# Patient Record
Sex: Female | Born: 1965
Health system: Southern US, Community
[De-identification: ages and names within clinical notes are randomized; demographics above are authoritative.]

---

## 2001-08-02 ENCOUNTER — Other Ambulatory Visit: Admission: RE | Admit: 2001-08-02 | Discharge: 2001-08-02 | Payer: Self-pay | Admitting: Obstetrics and Gynecology

## 2002-08-04 ENCOUNTER — Other Ambulatory Visit: Admission: RE | Admit: 2002-08-04 | Discharge: 2002-08-04 | Payer: Self-pay | Admitting: Obstetrics and Gynecology

## 2004-06-29 ENCOUNTER — Other Ambulatory Visit: Admission: RE | Admit: 2004-06-29 | Discharge: 2004-06-29 | Payer: Self-pay | Admitting: Obstetrics and Gynecology

## 2004-11-18 ENCOUNTER — Ambulatory Visit: Payer: Self-pay | Admitting: Family Medicine

## 2006-02-05 ENCOUNTER — Other Ambulatory Visit: Admission: RE | Admit: 2006-02-05 | Discharge: 2006-02-05 | Payer: Self-pay | Admitting: Obstetrics and Gynecology

## 2007-03-04 ENCOUNTER — Other Ambulatory Visit: Admission: RE | Admit: 2007-03-04 | Discharge: 2007-03-04 | Payer: Self-pay | Admitting: Obstetrics and Gynecology

## 2008-04-28 ENCOUNTER — Ambulatory Visit (HOSPITAL_COMMUNITY): Admission: RE | Admit: 2008-04-28 | Discharge: 2008-04-28 | Payer: Self-pay | Admitting: Obstetrics and Gynecology

## 2008-06-18 ENCOUNTER — Encounter: Payer: Self-pay | Admitting: Obstetrics and Gynecology

## 2008-06-18 ENCOUNTER — Other Ambulatory Visit: Admission: RE | Admit: 2008-06-18 | Discharge: 2008-06-18 | Payer: Self-pay | Admitting: Obstetrics and Gynecology

## 2008-06-18 ENCOUNTER — Ambulatory Visit: Payer: Self-pay | Admitting: Obstetrics and Gynecology

## 2008-09-16 ENCOUNTER — Emergency Department (HOSPITAL_COMMUNITY): Admission: EM | Admit: 2008-09-16 | Discharge: 2008-09-16 | Payer: Self-pay | Admitting: Emergency Medicine

## 2009-05-10 ENCOUNTER — Ambulatory Visit (HOSPITAL_COMMUNITY): Admission: RE | Admit: 2009-05-10 | Discharge: 2009-05-10 | Payer: Self-pay | Admitting: Obstetrics and Gynecology

## 2010-02-09 ENCOUNTER — Other Ambulatory Visit: Admission: RE | Admit: 2010-02-09 | Discharge: 2010-02-09 | Payer: Self-pay | Admitting: Obstetrics and Gynecology

## 2010-02-09 ENCOUNTER — Ambulatory Visit: Payer: Self-pay | Admitting: Obstetrics and Gynecology

## 2010-06-16 ENCOUNTER — Ambulatory Visit (HOSPITAL_COMMUNITY): Admission: RE | Admit: 2010-06-16 | Discharge: 2010-06-16 | Payer: Self-pay | Admitting: Obstetrics and Gynecology

## 2010-06-29 ENCOUNTER — Encounter: Admission: RE | Admit: 2010-06-29 | Discharge: 2010-06-29 | Payer: Self-pay | Admitting: Obstetrics and Gynecology

## 2010-10-08 ENCOUNTER — Encounter: Payer: Self-pay | Admitting: Obstetrics and Gynecology

## 2011-01-31 NOTE — Op Note (Signed)
NAME:  Carolyn Woodward, Carolyn Woodward NO.:  [id number]   MEDICAL RECORD NO.:  [id number]          PATIENT TYPE:  EMS   LOCATION:  [facility]                         FACILITY:  [facility]   PHYSICIAN:  Vanita Panda. Magnus Ivan, M.D.DATE OF BIRTH:  1966/01/10   DATE OF PROCEDURE:  DATE OF DISCHARGE:                               OPERATIVE REPORT   PREPROCEDURE DIAGNOSIS:  Right index finger tip crush injury with tuft  fracture and nail bed injury.   POSTPROCEDURE DIAGNOSIS:  Right index finger tip crush injury with tuft  fracture and nail bed injury.   PROCEDURE:  Right index finger nail bed repair.   SURGEON:  Vanita Panda. Magnus Ivan, MD   ANESTHESIA:  Local 1% plain lidocaine digital block, right index finger.   COMPLICATIONS:  None.   INDICATIONS:  Briefly, Ms. 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  She sustained a crushing  injury to the tip, and she came to the ER with the nail that had been  pulled out as well as the crush injury to her fingertip.  X-rays were  obtained and did show a tuft fracture of the distal phalanx.  The joints  were intact.  She had a normal exam as far as sensation of the tip per  the ER physicians' report before we blocked her with a lidocaine block  due to significant pain.  She had full range of motion of her MCP, PIP,  and DIP joints.   PROCEDURE DESCRIPTION:  After prepping the finger with Betadine, I  assessed the nail bed and found it to be significantly disrupted.  A  piece of bone from the very tip of the finger was removed easily.  I  then thoroughly irrigated the tissue around this normal saline solution  and repaired the nail bed with interrupted 5-0 chromic suture.  I had to  repair a laceration at the tip of the finger as well with interrupted 5-  0 Prolene suture.  The nail matrix itself seemed to be open, so I did  take  her nail that came with her, cleaned it with Betadine, then alcohol  and saline.  I placed several 5-0 Prolene sutures through the nail and  secured it up under the nail fold to serve as a spacer to allow a new  nail to hopefully grow back.  I then placed Xeroform around her finger,  followed by a well-padded sterile dressing.  The patient tolerated the  procedure well without complications.  Followup through the office in 4  days with dressing changes daily.  I will keep her on sedentary work  only and will address further injuries as needed.  She was discharged on  Hydrocodone for pain as well as doxycycline as antibiotic.      Vanita Panda. Magnus Ivan, M.D.  Electronically Signed     CYB/MEDQ  D:  09/16/2008  T:  09/17/2008  Job:  [id number]

## 2011-05-11 ENCOUNTER — Other Ambulatory Visit (HOSPITAL_COMMUNITY)
Admission: RE | Admit: 2011-05-11 | Discharge: 2011-05-11 | Disposition: A | Discharge status: Home / Self Care | Payer: BC Managed Care – PPO | Source: Ambulatory Visit | Attending: Obstetrics and Gynecology | Admitting: Obstetrics and Gynecology

## 2011-05-11 ENCOUNTER — Ambulatory Visit (INDEPENDENT_AMBULATORY_CARE_PROVIDER_SITE_OTHER): Payer: BC Managed Care – PPO | Admitting: Obstetrics and Gynecology

## 2011-05-11 ENCOUNTER — Encounter: Payer: Self-pay | Admitting: Obstetrics and Gynecology

## 2011-05-11 VITALS — BP 114/70 | Ht 65.0 in | Wt 122.0 lb

## 2011-05-11 DIAGNOSIS — Z01419 Encounter for gynecological examination (general) (routine) without abnormal findings: Secondary | ICD-10-CM | POA: Insufficient documentation

## 2011-05-11 NOTE — Progress Notes (Signed)
Patient came to see me today for an annual GYN exam. She is doing well with normal periods. She contracepts with condoms and is fine doing so.she is up-to-date on her mammograms.  Physical examination: HEENT within normal limits. Neck: Thyroid not large. No masses. Supraclavicular nodes: not enlarged. Breasts: Examined in both sitting midline position. No skin changes and no masses. Abdomen: Soft no guarding rebound or masses or hernia. Pelvic: External: Within normal limits. BUS: Within normal limits. Vaginal:within normal limits. Good estrogen effect. No evidence of cystocele rectocele or enterocele. Cervix: clean. Uterus: Normal size and shape. Adnexa: No masses. Rectovaginal exam: Confirmatory and negative. Extremities: Within normal limits.  Assessment: Normal GYN exam  Plan: Continue yearly mammograms. Discussed Mirena IUD, she will inform me when necessary.

## 2012-02-14 ENCOUNTER — Other Ambulatory Visit: Payer: Self-pay | Admitting: Obstetrics and Gynecology

## 2012-02-14 DIAGNOSIS — Z1231 Encounter for screening mammogram for malignant neoplasm of breast: Secondary | ICD-10-CM

## 2012-02-29 ENCOUNTER — Ambulatory Visit
Admission: RE | Admit: 2012-02-29 | Discharge: 2012-02-29 | Disposition: A | Discharge status: Home / Self Care | Payer: BC Managed Care – PPO | Source: Ambulatory Visit | Attending: Obstetrics and Gynecology | Admitting: Obstetrics and Gynecology

## 2012-02-29 DIAGNOSIS — Z1231 Encounter for screening mammogram for malignant neoplasm of breast: Secondary | ICD-10-CM

## 2012-08-28 ENCOUNTER — Ambulatory Visit (INDEPENDENT_AMBULATORY_CARE_PROVIDER_SITE_OTHER): Payer: BC Managed Care – PPO | Admitting: Obstetrics and Gynecology

## 2012-08-28 ENCOUNTER — Other Ambulatory Visit (HOSPITAL_COMMUNITY)
Admission: RE | Admit: 2012-08-28 | Discharge: 2012-08-28 | Disposition: A | Discharge status: Home / Self Care | Payer: BC Managed Care – PPO | Source: Ambulatory Visit | Attending: Obstetrics and Gynecology | Admitting: Obstetrics and Gynecology

## 2012-08-28 ENCOUNTER — Encounter: Payer: Self-pay | Admitting: Obstetrics and Gynecology

## 2012-08-28 VITALS — BP 114/66 | Ht 65.0 in | Wt 125.0 lb

## 2012-08-28 DIAGNOSIS — Z01419 Encounter for gynecological examination (general) (routine) without abnormal findings: Secondary | ICD-10-CM | POA: Insufficient documentation

## 2012-08-28 DIAGNOSIS — N841 Polyp of cervix uteri: Secondary | ICD-10-CM

## 2012-08-28 NOTE — Patient Instructions (Signed)
Continue yearly mammograms. We will call you with polyp results.

## 2012-08-28 NOTE — Addendum Note (Signed)
Addended by: Dayna Barker on: 08/28/2012 04:33 PM   Modules accepted: Orders

## 2012-08-28 NOTE — Progress Notes (Signed)
Patient came to see me today for her annual GYN exam. She is having regular cycles. She uses condoms for birth control. She is having no unusual pain or bleeding. She has always had an extremely prominent right rib above her breast which we feel on exam. It has been there her whole life. When she's had mammograms we have imaged  that area specifically with no abnormal findings. She has always had normal Pap smears. Her last Pap smear was 2012. She just had her yearly mammogram. She brought her lab work from work.  Physical examination:Carolyn Woodward. HEENT within normal limits. Neck: Thyroid not large. No masses. Supraclavicular nodes: not enlarged. Breasts: Examined in both sitting and lying  position. No skin changes and no masses.In the area of the right breast above the nipple there is a firm area which corresponds with the rib and is unchanged from all her previous exams here. It is consistent with her skeleton and not a breast mass and once again no breast mass was found on mammograms. Abdomen: Soft no guarding rebound or masses or hernia. Pelvic: External: Within normal limits. BUS: Within normal limits. Vaginal:within normal limits. Good estrogen effect. No evidence of cystocele rectocele or enterocele. Cervix: Polyp coming out of external os.  Uterus: Normal size and shape. Adnexa: No masses. Rectovaginal exam: Confirmatory and negative. Extremities: Within normal limits.  Assessment: Endocervical polyp  Plan:The new Pap smear guidelines were discussed with the patient. Pap done at patient's request. Cervical polypectomy done with ease. We will call patient with results. Continue yearly  mammograms

## 2012-08-29 LAB — URINALYSIS W MICROSCOPIC + REFLEX CULTURE
Bacteria, UA: NONE SEEN
Casts: NONE SEEN
Hgb urine dipstick: NEGATIVE
Ketones, ur: NEGATIVE mg/dL
Leukocytes, UA: NEGATIVE
Nitrite: NEGATIVE
Specific Gravity, Urine: 1.01 (ref 1.005–1.030)
Urobilinogen, UA: 0.2 mg/dL (ref 0.0–1.0)
pH: 7 (ref 5.0–8.0)

## 2012-08-29 LAB — CBC WITH DIFFERENTIAL/PLATELET
Basophils Absolute: 0 10*3/uL (ref 0.0–0.1)
Eosinophils Relative: 1 % (ref 0–5)
HCT: 36.9 % (ref 36.0–46.0)
Hemoglobin: 12.3 g/dL (ref 12.0–15.0)
Lymphocytes Relative: 33 % (ref 12–46)
Lymphs Abs: 1.7 10*3/uL (ref 0.7–4.0)
MCV: 87.2 fL (ref 78.0–100.0)
Monocytes Absolute: 0.5 10*3/uL (ref 0.1–1.0)
Monocytes Relative: 10 % (ref 3–12)
Neutro Abs: 3 10*3/uL (ref 1.7–7.7)
RDW: 13.8 % (ref 11.5–15.5)
WBC: 5.4 10*3/uL (ref 4.0–10.5)

## 2013-07-07 ENCOUNTER — Other Ambulatory Visit: Payer: Self-pay

## 2013-07-07 DIAGNOSIS — Z1231 Encounter for screening mammogram for malignant neoplasm of breast: Secondary | ICD-10-CM

## 2013-07-25 ENCOUNTER — Ambulatory Visit
Admission: RE | Admit: 2013-07-25 | Discharge: 2013-07-25 | Disposition: A | Discharge status: Home / Self Care | Payer: BC Managed Care – PPO | Source: Ambulatory Visit

## 2013-07-25 DIAGNOSIS — Z1231 Encounter for screening mammogram for malignant neoplasm of breast: Secondary | ICD-10-CM

## 2014-04-10 ENCOUNTER — Ambulatory Visit (INDEPENDENT_AMBULATORY_CARE_PROVIDER_SITE_OTHER): Payer: BC Managed Care – PPO | Admitting: Gynecology

## 2014-04-10 ENCOUNTER — Other Ambulatory Visit: Payer: Self-pay | Admitting: Gynecology

## 2014-04-10 ENCOUNTER — Encounter: Payer: Self-pay | Admitting: Gynecology

## 2014-04-10 ENCOUNTER — Other Ambulatory Visit (HOSPITAL_COMMUNITY)
Admission: RE | Admit: 2014-04-10 | Discharge: 2014-04-10 | Disposition: A | Discharge status: Home / Self Care | Payer: BC Managed Care – PPO | Source: Ambulatory Visit | Attending: Gynecology | Admitting: Gynecology

## 2014-04-10 VITALS — BP 110/68 | Ht 65.0 in | Wt 119.0 lb

## 2014-04-10 DIAGNOSIS — Z309 Encounter for contraceptive management, unspecified: Secondary | ICD-10-CM

## 2014-04-10 DIAGNOSIS — Z1151 Encounter for screening for human papillomavirus (HPV): Secondary | ICD-10-CM | POA: Insufficient documentation

## 2014-04-10 DIAGNOSIS — Z01419 Encounter for gynecological examination (general) (routine) without abnormal findings: Secondary | ICD-10-CM | POA: Insufficient documentation

## 2014-04-10 DIAGNOSIS — N841 Polyp of cervix uteri: Secondary | ICD-10-CM

## 2014-04-10 LAB — COMPREHENSIVE METABOLIC PANEL
ALK PHOS: 81 U/L (ref 39–117)
ALT: 27 U/L (ref 0–35)
AST: 28 U/L (ref 0–37)
Albumin: 3.7 g/dL (ref 3.5–5.2)
BUN: 12 mg/dL (ref 6–23)
CALCIUM: 8.4 mg/dL (ref 8.4–10.5)
CHLORIDE: 102 meq/L (ref 96–112)
CO2: 24 mEq/L (ref 19–32)
CREATININE: 0.7 mg/dL (ref 0.50–1.10)
Glucose, Bld: 79 mg/dL (ref 70–99)
POTASSIUM: 3.6 meq/L (ref 3.5–5.3)
Sodium: 135 mEq/L (ref 135–145)
Total Bilirubin: 1.5 mg/dL — ABNORMAL HIGH (ref 0.2–1.2)
Total Protein: 6.2 g/dL (ref 6.0–8.3)

## 2014-04-10 LAB — LIPID PANEL
CHOL/HDL RATIO: 1.8 ratio
Cholesterol: 134 mg/dL (ref 0–200)
HDL: 76 mg/dL (ref >39)
LDL CALC: 47 mg/dL (ref 0–99)
Triglycerides: 57 mg/dL (ref <150)
VLDL: 11 mg/dL (ref 0–40)

## 2014-04-10 LAB — CBC WITH DIFFERENTIAL/PLATELET
BASOS ABS: 0 10*3/uL (ref 0.0–0.1)
Basophils Relative: 0 % (ref 0–1)
EOS PCT: 0 % (ref 0–5)
Eosinophils Absolute: 0 10*3/uL (ref 0.0–0.7)
HCT: 31.8 % — ABNORMAL LOW (ref 36.0–46.0)
Hemoglobin: 10.9 g/dL — ABNORMAL LOW (ref 12.0–15.0)
LYMPHS PCT: 28 % (ref 12–46)
Lymphs Abs: 1.3 10*3/uL (ref 0.7–4.0)
MCH: 29.3 pg (ref 26.0–34.0)
MCHC: 34.3 g/dL (ref 30.0–36.0)
MCV: 85.5 fL (ref 78.0–100.0)
Monocytes Absolute: 0.5 10*3/uL (ref 0.1–1.0)
Monocytes Relative: 10 % (ref 3–12)
NEUTROS ABS: 2.9 10*3/uL (ref 1.7–7.7)
Neutrophils Relative %: 62 % (ref 43–77)
PLATELETS: 161 10*3/uL (ref 150–400)
RBC: 3.72 MIL/uL — ABNORMAL LOW (ref 3.87–5.11)
RDW: 13.7 % (ref 11.5–15.5)
WBC: 4.7 10*3/uL (ref 4.0–10.5)

## 2014-04-10 NOTE — Addendum Note (Signed)
Addended by: Richardson ChiquitoWILKINSON, Minda Faas S on: 04/10/2014 12:42 PM   Modules accepted: Orders

## 2014-04-10 NOTE — Progress Notes (Signed)
Carolyn Woodward Aug 25, 1966 [id number]        48 y.o.  G2P1011 for annual exam.  Doing well. Former patient Carolyn Woodward.  Past medical history,surgical history, problem list, medications, allergies, family history and social history were all reviewed and documented as reviewed in the EPIC chart.  ROS:  12 system ROS performed with pertinent positives and negatives included in the history, assessment and plan.   Additional significant findings :  None   Exam: Carolyn RistKari Woodward Filed Vitals:   04/10/14 1206  BP: 110/68  Height: 5\' 5"  (1.651 m)  Weight: 119 lb (53.978 kg)   General appearance:  Normal affect, orientation and appearance. Skin: Grossly normal HEENT: Without gross lesions.  No cervical or supraclavicular adenopathy. Thyroid normal.  Lungs:  Clear without wheezing, rales or rhonchi Cardiac: RR, without RMG Abdominal:  Soft, nontender, without masses, guarding, rebound, organomegaly or hernia Breasts:  Examined lying and sitting without masses, retractions, discharge or axillary adenopathy. Pelvic:  Ext/BUS/vagina normal  Cervix with endocervical polyp extruding from the external os. Biopsied off and sent to pathology. Pap ECC done before biopsy.  Uterus anteverted, normal size, shape and contour, midline and mobile nontender   Adnexa  Without masses or tenderness    Anus and perineum  Normal   Rectovaginal  Normal sphincter tone without palpated masses or tenderness.    Assessment/Plan:  48 y.o. 522P1011 female for annual exam with regular menses, condom contraception.   1. Contraceptive management. Patient using condoms as she has for years and is comfortable with this. Reviewed options and she declines alternatives. I reviewed failure risks and availability of plan B. 2. Endocervical polyp. Had one 2 years ago when she saw Carolyn Woodward which was biopsied off and benign. I biopsied this one off and sent to pathology. Patient will followup results. 3. Pap smear 2013. HPV  done before the biopsy. No history of significant abnormal Pap smears. 4. Mammography 07/2013. Continue with annual mammography this fall. SBE monthly reviewed. 5. Health maintenance. Sign CBC comprehensive metabolic panel lipid profile urinalysis ordered. Followup one year, sooner as needed.   Note: This document was prepared with digital dictation and possible smart phrase technology. Any transcriptional errors that result from this process are unintentional.   Carolyn LordsFONTAINE,TIMOTHY P MD, 12:29 PM 04/10/2014

## 2014-04-10 NOTE — Addendum Note (Signed)
Addended by: Richardson ChiquitoWILKINSON, KARI S on: 04/10/2014 12:39 PM   Modules accepted: Orders

## 2014-04-10 NOTE — Patient Instructions (Signed)
Office will call you with biopsy results.  You may obtain a copy of any labs that were done today by logging onto MyChart as outlined in the instructions provided with your AVS (after visit summary). The office will not call with normal lab results but certainly if there are any significant abnormalities then we will contact you.   Health Maintenance, Female A healthy lifestyle and preventative care can promote health and wellness.  Maintain regular health, dental, and eye exams.  Eat a healthy diet. Foods like vegetables, fruits, whole grains, low-fat dairy products, and lean protein foods contain the nutrients you need without too many calories. Decrease your intake of foods high in solid fats, added sugars, and salt. Get information about a proper diet from your caregiver, if necessary.  Regular physical exercise is one of the most important things you can do for your health. Most adults should get at least 150 minutes of moderate-intensity exercise (any activity that increases your heart rate and causes you to sweat) each week. In addition, most adults need muscle-strengthening exercises on 2 or more days a week.   Maintain a healthy weight. The body mass index (BMI) is a screening tool to identify possible weight problems. It provides an estimate of body fat based on height and weight. Your caregiver can help determine your BMI, and can help you achieve or maintain a healthy weight. For adults 20 years and older:  A BMI below 18.5 is considered underweight.  A BMI of 18.5 to 24.9 is normal.  A BMI of 25 to 29.9 is considered overweight.  A BMI of 30 and above is considered obese.  Maintain normal blood lipids and cholesterol by exercising and minimizing your intake of saturated fat. Eat a balanced diet with plenty of fruits and vegetables. Blood tests for lipids and cholesterol should begin at age 20 and be repeated every 5 years. If your lipid or cholesterol levels are high, you are  over 50, or you are a high risk for heart disease, you may need your cholesterol levels checked more frequently.Ongoing high lipid and cholesterol levels should be treated with medicines if diet and exercise are not effective.  If you smoke, find out from your caregiver how to quit. If you do not use tobacco, do not start.  Lung cancer screening is recommended for adults aged 55 80 years who are at high risk for developing lung cancer because of a history of smoking. Yearly low-dose computed tomography (CT) is recommended for people who have at least a 30-pack-year history of smoking and are a current smoker or have quit within the past 15 years. A pack year of smoking is smoking an average of 1 pack of cigarettes a day for 1 year (for example: 1 pack a day for 30 years or 2 packs a day for 15 years). Yearly screening should continue until the smoker has stopped smoking for at least 15 years. Yearly screening should also be stopped for people who develop a health problem that would prevent them from having lung cancer treatment.  If you are pregnant, do not drink alcohol. If you are breastfeeding, be very cautious about drinking alcohol. If you are not pregnant and choose to drink alcohol, do not exceed 1 drink per day. One drink is considered to be 12 ounces (355 mL) of beer, 5 ounces (148 mL) of wine, or 1.5 ounces (44 mL) of liquor.  Avoid use of street drugs. Do not share needles with anyone. Ask for help   if you need support or instructions about stopping the use of drugs.  High blood pressure causes heart disease and increases the risk of stroke. Blood pressure should be checked at least every 1 to 2 years. Ongoing high blood pressure should be treated with medicines, if weight loss and exercise are not effective.  If you are 55 to 47 years old, ask your caregiver if you should take aspirin to prevent strokes.  Diabetes screening involves taking a blood sample to check your fasting blood sugar level.  This should be done once every 3 years, after age 45, if you are within normal weight and without risk factors for diabetes. Testing should be considered at a younger age or be carried out more frequently if you are overweight and have at least 1 risk factor for diabetes.  Breast cancer screening is essential preventative care for women. You should practice "breast self-awareness." This means understanding the normal appearance and feel of your breasts and may include breast self-examination. Any changes detected, no matter how small, should be reported to a caregiver. Women in their 20s and 30s should have a clinical breast exam (CBE) by a caregiver as part of a regular health exam every 1 to 3 years. After age 40, women should have a CBE every year. Starting at age 40, women should consider having a mammogram (breast X-ray) every year. Women who have a family history of breast cancer should talk to their caregiver about genetic screening. Women at a high risk of breast cancer should talk to their caregiver about having an MRI and a mammogram every year.  Breast cancer gene (BRCA)-related cancer risk assessment is recommended for women who have family members with BRCA-related cancers. BRCA-related cancers include breast, ovarian, tubal, and peritoneal cancers. Having family members with these cancers may be associated with an increased risk for harmful changes (mutations) in the breast cancer genes BRCA1 and BRCA2. Results of the assessment will determine the need for genetic counseling and BRCA1 and BRCA2 testing.  The Pap test is a screening test for cervical cancer. Women should have a Pap test starting at age 21. Between ages 21 and 29, Pap tests should be repeated every 2 years. Beginning at age 30, you should have a Pap test every 3 years as long as the past 3 Pap tests have been normal. If you had a hysterectomy for a problem that was not cancer or a condition that could lead to cancer, then you no  longer need Pap tests. If you are between ages 65 and 70, and you have had normal Pap tests going back 10 years, you no longer need Pap tests. If you have had past treatment for cervical cancer or a condition that could lead to cancer, you need Pap tests and screening for cancer for at least 20 years after your treatment. If Pap tests have been discontinued, risk factors (such as a new sexual partner) need to be reassessed to determine if screening should be resumed. Some women have medical problems that increase the chance of getting cervical cancer. In these cases, your caregiver may recommend more frequent screening and Pap tests.  The human papillomavirus (HPV) test is an additional test that may be used for cervical cancer screening. The HPV test looks for the virus that can cause the cell changes on the cervix. The cells collected during the Pap test can be tested for HPV. The HPV test could be used to screen women aged 30 years and older, and   should be used in women of any age who have unclear Pap test results. After the age of 7, women should have HPV testing at the same frequency as a Pap test.  Colorectal cancer can be detected and often prevented. Most routine colorectal cancer screening begins at the age of 20 and continues through age 36. However, your caregiver may recommend screening at an earlier age if you have risk factors for colon cancer. On a yearly basis, your caregiver may provide home test kits to check for hidden blood in the stool. Use of a small camera at the end of a tube, to directly examine the colon (sigmoidoscopy or colonoscopy), can detect the earliest forms of colorectal cancer. Talk to your caregiver about this at age 46, when routine screening begins. Direct examination of the colon should be repeated every 5 to 10 years through age 62, unless early forms of pre-cancerous polyps or small growths are found.  Hepatitis C blood testing is recommended for all people born  from [birth year] through 1965 and any individual with known risks for hepatitis C.  Practice safe sex. Use condoms and avoid high-risk sexual practices to reduce the spread of sexually transmitted infections (STIs). Sexually active women aged 55 and younger should be checked for Chlamydia, which is a common sexually transmitted infection. Older women with new or multiple partners should also be tested for Chlamydia. Testing for other STIs is recommended if you are sexually active and at increased risk.  Osteoporosis is a disease in which the bones lose minerals and strength with aging. This can result in serious bone fractures. The risk of osteoporosis can be identified using a bone density scan. Women ages 31 and over and women at risk for fractures or osteoporosis should discuss screening with their caregivers. Ask your caregiver whether you should be taking a calcium supplement or vitamin D to reduce the rate of osteoporosis.  Menopause can be associated with physical symptoms and risks. Hormone replacement therapy is available to decrease symptoms and risks. You should talk to your caregiver about whether hormone replacement therapy is right for you.  Use sunscreen. Apply sunscreen liberally and repeatedly throughout the day. You should seek shade when your shadow is shorter than you. Protect yourself by wearing long sleeves, pants, a wide-brimmed hat, and sunglasses year round, whenever you are outdoors.  Notify your caregiver of new moles or changes in moles, especially if there is a change in shape or color. Also notify your caregiver if a mole is larger than the size of a pencil eraser.  Stay current with your immunizations. Document Released: 03/20/2011 Document Revised: 12/30/2012 Document Reviewed: 03/20/2011 [facility] Patient Information 2014 [place].

## 2014-04-11 LAB — URINALYSIS W MICROSCOPIC + REFLEX CULTURE
Bilirubin Urine: NEGATIVE
Casts: NONE SEEN
Crystals: NONE SEEN
GLUCOSE, UA: NEGATIVE mg/dL
Hgb urine dipstick: NEGATIVE
Ketones, ur: 15 mg/dL — AB
LEUKOCYTES UA: NEGATIVE
NITRITE: NEGATIVE
PROTEIN: NEGATIVE mg/dL
SQUAMOUS EPITHELIAL / LPF: NONE SEEN
Specific Gravity, Urine: 1.007 (ref 1.005–1.030)
UROBILINOGEN UA: 0.2 mg/dL (ref 0.0–1.0)
pH: 5.5 (ref 5.0–8.0)

## 2014-04-14 LAB — CYTOLOGY - PAP

## 2014-07-20 ENCOUNTER — Encounter: Payer: Self-pay | Admitting: Gynecology

## 2015-03-30 ENCOUNTER — Other Ambulatory Visit: Payer: Self-pay

## 2015-03-30 DIAGNOSIS — Z1231 Encounter for screening mammogram for malignant neoplasm of breast: Secondary | ICD-10-CM

## 2015-04-07 ENCOUNTER — Ambulatory Visit
Admission: RE | Admit: 2015-04-07 | Discharge: 2015-04-07 | Disposition: A | Discharge status: Home / Self Care | Payer: BLUE CROSS/BLUE SHIELD | Source: Ambulatory Visit

## 2015-04-07 DIAGNOSIS — Z1231 Encounter for screening mammogram for malignant neoplasm of breast: Secondary | ICD-10-CM

## 2015-05-21 ENCOUNTER — Ambulatory Visit (INDEPENDENT_AMBULATORY_CARE_PROVIDER_SITE_OTHER): Payer: BLUE CROSS/BLUE SHIELD | Admitting: Gynecology

## 2015-05-21 ENCOUNTER — Encounter: Payer: Self-pay | Admitting: Gynecology

## 2015-05-21 VITALS — BP 110/70 | Ht 64.5 in | Wt 118.0 lb

## 2015-05-21 DIAGNOSIS — Z01419 Encounter for gynecological examination (general) (routine) without abnormal findings: Secondary | ICD-10-CM

## 2015-05-21 LAB — CBC WITH DIFFERENTIAL/PLATELET
BASOS ABS: 0 10*3/uL (ref 0.0–0.1)
BASOS PCT: 0 % (ref 0–1)
EOS ABS: 0 10*3/uL (ref 0.0–0.7)
EOS PCT: 1 % (ref 0–5)
HCT: 37.9 % (ref 36.0–46.0)
Hemoglobin: 12.3 g/dL (ref 12.0–15.0)
LYMPHS PCT: 33 % (ref 12–46)
Lymphs Abs: 1 10*3/uL (ref 0.7–4.0)
MCH: 29.1 pg (ref 26.0–34.0)
MCHC: 32.5 g/dL (ref 30.0–36.0)
MCV: 89.6 fL (ref 78.0–100.0)
MPV: 11.6 fL (ref 8.6–12.4)
Monocytes Absolute: 0.3 10*3/uL (ref 0.1–1.0)
Monocytes Relative: 10 % (ref 3–12)
Neutro Abs: 1.6 10*3/uL — ABNORMAL LOW (ref 1.7–7.7)
Neutrophils Relative %: 56 % (ref 43–77)
PLATELETS: 181 10*3/uL (ref 150–400)
RBC: 4.23 MIL/uL (ref 3.87–5.11)
RDW: 14.9 % (ref 11.5–15.5)
WBC: 2.9 10*3/uL — AB (ref 4.0–10.5)

## 2015-05-21 MED ORDER — SCOPOLAMINE 1 MG/3DAYS TD PT72: 1.0000 | MEDICATED_PATCH | TRANSDERMAL | Status: DC

## 2015-05-21 NOTE — Progress Notes (Signed)
Carolyn Woodward December 17, 1965 [id number]        49 y.o.  G2P1011 for annual exam.  Doing well without complaints  Past medical history,surgical history, problem list, medications, allergies, family history and social history were all reviewed and documented as reviewed in the EPIC chart.  ROS:  Performed with pertinent positives and negatives included in the history, assessment and plan.   Additional significant findings :  none   Exam: [facility] Vitals:   05/21/15 0803  BP: 110/70  Height: 5' 4.5" (1.638 m)  Weight: 118 lb (53.524 kg)   General appearance:  Normal affect, orientation and appearance. Skin: Grossly normal HEENT: Without gross lesions.  No cervical or supraclavicular adenopathy. Thyroid normal.  Lungs:  Clear without wheezing, rales or rhonchi Cardiac: RR, without RMG Abdominal:  Soft, nontender, without masses, guarding, rebound, organomegaly or hernia Breasts:  Examined lying and sitting without masses, retractions, discharge or axillary adenopathy. Pelvic:  Ext/BUS/vagina normal  Cervix normal  Uterus axial, normal size, shape and contour, midline and mobile nontender   Adnexa  Without masses or tenderness    Anus and perineum  Normal   Rectovaginal  Normal sphincter tone without palpated masses or tenderness.    Assessment/Plan:  49 y.o. G42P1011 female for annual exam with regular menses, condom contraception.   1. Contraception. Patient continues with condoms and is comfortable with this. Recognizes failure risk and availability of Plan B. 2. Pap smear/HPV negative 2015. No Pap smear done today. No history of abnormal Pap smears previously. Plan repeat Pap smear 3-5 year interval progression screening guidelines. 3. Mammography 03/2015. Continue with annual mammography. SBE monthly reviewed. 4. Health maintenance. Patient traveling on a cruise and requested scopolamine patch. 2 patches provided with instruction. Baseline CBC comprehensive metabolic panel  lipid profile urinalysis ordered. Follow up in one year, sooner as needed.   Dara Lords MD, 8:28 AM 05/21/2015

## 2015-05-21 NOTE — Patient Instructions (Signed)

## 2015-05-22 LAB — LIPID PANEL
Cholesterol: 151 mg/dL (ref 125–200)
HDL: 81 mg/dL (ref >=46)
LDL CALC: 58 mg/dL (ref <130)
Total CHOL/HDL Ratio: 1.9 Ratio (ref <=5.0)
Triglycerides: 62 mg/dL (ref <150)
VLDL: 12 mg/dL (ref <30)

## 2015-05-22 LAB — COMPREHENSIVE METABOLIC PANEL
ALK PHOS: 92 U/L (ref 33–115)
ALT: 29 U/L (ref 6–29)
AST: 27 U/L (ref 10–35)
Albumin: 3.9 g/dL (ref 3.6–5.1)
BILIRUBIN TOTAL: 1.4 mg/dL — AB (ref 0.2–1.2)
BUN: 14 mg/dL (ref 7–25)
CO2: 29 mmol/L (ref 20–31)
Calcium: 8.8 mg/dL (ref 8.6–10.2)
Chloride: 105 mmol/L (ref 98–110)
Creat: 0.67 mg/dL (ref 0.50–1.10)
GLUCOSE: 94 mg/dL (ref 65–99)
Potassium: 4.1 mmol/L (ref 3.5–5.3)
SODIUM: 138 mmol/L (ref 135–146)
Total Protein: 6.3 g/dL (ref 6.1–8.1)

## 2015-05-22 LAB — URINALYSIS W MICROSCOPIC + REFLEX CULTURE
BILIRUBIN URINE: NEGATIVE
Casts: NONE SEEN [LPF]
Crystals: NONE SEEN [HPF]
GLUCOSE, UA: NEGATIVE
HGB URINE DIPSTICK: NEGATIVE
LEUKOCYTES UA: NEGATIVE
Nitrite: NEGATIVE
PH: 5 (ref 5.0–8.0)
PROTEIN: NEGATIVE
Specific Gravity, Urine: 1.014 (ref 1.001–1.035)
WBC UA: NONE SEEN WBC/HPF (ref <=5)
Yeast: NONE SEEN [HPF]

## 2015-05-23 LAB — URINE CULTURE
COLONY COUNT: NO GROWTH
ORGANISM ID, BACTERIA: NO GROWTH

## 2015-05-25 ENCOUNTER — Other Ambulatory Visit: Payer: Self-pay | Admitting: Gynecology

## 2015-05-25 DIAGNOSIS — D72819 Decreased white blood cell count, unspecified: Secondary | ICD-10-CM

## 2015-07-25 ENCOUNTER — Ambulatory Visit (INDEPENDENT_AMBULATORY_CARE_PROVIDER_SITE_OTHER): Payer: BLUE CROSS/BLUE SHIELD | Admitting: Family Medicine

## 2015-07-25 VITALS — BP 100/60 | HR 64 | Temp 98.3°F | Resp 18 | Ht 65.0 in | Wt 118.8 lb

## 2015-07-25 DIAGNOSIS — Z130 Encounter for screening for diseases of the blood and blood-forming organs and certain disorders involving the immune mechanism: Secondary | ICD-10-CM

## 2015-07-25 DIAGNOSIS — Z1321 Encounter for screening for nutritional disorder: Secondary | ICD-10-CM | POA: Diagnosis not present

## 2015-07-25 DIAGNOSIS — Z1329 Encounter for screening for other suspected endocrine disorder: Secondary | ICD-10-CM

## 2015-07-25 DIAGNOSIS — Z0000 Encounter for general adult medical examination without abnormal findings: Secondary | ICD-10-CM | POA: Diagnosis not present

## 2015-07-25 DIAGNOSIS — D72819 Decreased white blood cell count, unspecified: Secondary | ICD-10-CM

## 2015-07-25 DIAGNOSIS — Z23 Encounter for immunization: Secondary | ICD-10-CM | POA: Diagnosis not present

## 2015-07-25 LAB — POCT CBC
Granulocyte percent: 65 %G (ref 37–80)
HCT, POC: 36 % — AB (ref 37.7–47.9)
Hemoglobin: 12.2 g/dL (ref 12.2–16.2)
LYMPH, POC: 1.2 (ref 0.6–3.4)
MCH: 30.7 pg (ref 27–31.2)
MCHC: 33.9 g/dL (ref 31.8–35.4)
MCV: 90.6 fL (ref 80–97)
MID (CBC): 0.3 (ref 0–0.9)
MPV: 7.9 fL (ref 0–99.8)
POC Granulocyte: 2.7 (ref 2–6.9)
POC LYMPH %: 28.8 % (ref 10–50)
POC MID %: 6.2 % (ref 0–12)
Platelet Count, POC: 173 10*3/uL (ref 142–424)
RBC: 3.97 M/uL — AB (ref 4.04–5.48)
RDW, POC: 14 %
WBC: 4.2 10*3/uL — AB (ref 4.6–10.2)

## 2015-07-25 LAB — HIV ANTIBODY (ROUTINE TESTING W REFLEX): HIV 1&2 Ab, 4th Generation: NONREACTIVE

## 2015-07-25 LAB — TSH: TSH: 0.868 u[IU]/mL (ref 0.350–4.500)

## 2015-07-25 NOTE — Patient Instructions (Signed)
Health Maintenance, Female Adopting a healthy lifestyle and getting preventive care can go a long way to promote health and wellness. Talk with your health care provider about what schedule of regular examinations is right for you. This is a good chance for you to check in with your provider about disease prevention and staying healthy. In between checkups, there are plenty of things you can do on your own. Experts have done a lot of research about which lifestyle changes and preventive measures are most likely to keep you healthy. Ask your health care provider for more information. WEIGHT AND DIET  Eat a healthy diet  Be sure to include plenty of vegetables, fruits, low-fat dairy products, and lean protein.  Do not eat a lot of foods high in solid fats, added sugars, or salt.  Get regular exercise. This is one of the most important things you can do for your health.  Most adults should exercise for at least 150 minutes each week. The exercise should increase your heart rate and make you sweat (moderate-intensity exercise).  Most adults should also do strengthening exercises at least twice a week. This is in addition to the moderate-intensity exercise.  Maintain a healthy weight  Body mass index (BMI) is a measurement that can be used to identify possible weight problems. It estimates body fat based on height and weight. Your health care provider can help determine your BMI and help you achieve or maintain a healthy weight.  For females 20 years of age and older:   A BMI below 18.5 is considered underweight.  A BMI of 18.5 to 24.9 is normal.  A BMI of 25 to 29.9 is considered overweight.  A BMI of 30 and above is considered obese.  Watch levels of cholesterol and blood lipids  You should start having your blood tested for lipids and cholesterol at 48 years of age, then have this test every 5 years.  You may need to have your cholesterol levels checked more often if:  Your lipid  or cholesterol levels are high.  You are older than 48 years of age.  You are at high risk for heart disease.  CANCER SCREENING   Lung Cancer  Lung cancer screening is recommended for adults 55-80 years old who are at high risk for lung cancer because of a history of smoking.  A yearly low-dose CT scan of the lungs is recommended for people who:  Currently smoke.  Have quit within the past 15 years.  Have at least a 30-pack-year history of smoking. A pack year is smoking an average of one pack of cigarettes a day for 1 year.  Yearly screening should continue until it has been 15 years since you quit.  Yearly screening should stop if you develop a health problem that would prevent you from having lung cancer treatment.  Breast Cancer  Practice breast self-awareness. This means understanding how your breasts normally appear and feel.  It also means doing regular breast self-exams. Let your health care provider know about any changes, no matter how small.  If you are in your 20s or 30s, you should have a clinical breast exam (CBE) by a health care provider every 1-3 years as part of a regular health exam.  If you are 40 or older, have a CBE every year. Also consider having a breast X-ray (mammogram) every year.  If you have a family history of breast cancer, talk to your health care provider about genetic screening.  If you   are at high risk for breast cancer, talk to your health care provider about having an MRI and a mammogram every year.  Breast cancer gene (BRCA) assessment is recommended for women who have family members with BRCA-related cancers. BRCA-related cancers include:  Breast.  Ovarian.  Tubal.  Peritoneal cancers.  Results of the assessment will determine the need for genetic counseling and BRCA1 and BRCA2 testing. Cervical Cancer Your health care provider may recommend that you be screened regularly for cancer of the pelvic organs (ovaries, uterus, and  vagina). This screening involves a pelvic examination, including checking for microscopic changes to the surface of your cervix (Pap test). You may be encouraged to have this screening done every 3 years, beginning at age 21.  For women ages 30-65, health care providers may recommend pelvic exams and Pap testing every 3 years, or they may recommend the Pap and pelvic exam, combined with testing for human papilloma virus (HPV), every 5 years. Some types of HPV increase your risk of cervical cancer. Testing for HPV may also be done on women of any age with unclear Pap test results.  Other health care providers may not recommend any screening for nonpregnant women who are considered low risk for pelvic cancer and who do not have symptoms. Ask your health care provider if a screening pelvic exam is right for you.  If you have had past treatment for cervical cancer or a condition that could lead to cancer, you need Pap tests and screening for cancer for at least 20 years after your treatment. If Pap tests have been discontinued, your risk factors (such as having a new sexual partner) need to be reassessed to determine if screening should resume. Some women have medical problems that increase the chance of getting cervical cancer. In these cases, your health care provider may recommend more frequent screening and Pap tests. Colorectal Cancer  This type of cancer can be detected and often prevented.  Routine colorectal cancer screening usually begins at 48 years of age and continues through 48 years of age.  Your health care provider may recommend screening at an earlier age if you have risk factors for colon cancer.  Your health care provider may also recommend using home test kits to check for hidden blood in the stool.  A small camera at the end of a tube can be used to examine your colon directly (sigmoidoscopy or colonoscopy). This is done to check for the earliest forms of colorectal  cancer.  Routine screening usually begins at age 50.  Direct examination of the colon should be repeated every 5-10 years through 48 years of age. However, you may need to be screened more often if early forms of precancerous polyps or small growths are found. Skin Cancer  Check your skin from head to toe regularly.  Tell your health care provider about any new moles or changes in moles, especially if there is a change in a mole's shape or color.  Also tell your health care provider if you have a mole that is larger than the size of a pencil eraser.  Always use sunscreen. Apply sunscreen liberally and repeatedly throughout the day.  Protect yourself by wearing long sleeves, pants, a wide-brimmed hat, and sunglasses whenever you are outside. HEART DISEASE, DIABETES, AND HIGH BLOOD PRESSURE   High blood pressure causes heart disease and increases the risk of stroke. High blood pressure is more likely to develop in:  People who have blood pressure in the high end   of the normal range (130-139/85-89 mm Hg).  People who are overweight or obese.  People who are African American.  If you are 38-23 years of age, have your blood pressure checked every 3-5 years. If you are 61 years of age or older, have your blood pressure checked every year. You should have your blood pressure measured twice--once when you are at a hospital or clinic, and once when you are not at a hospital or clinic. Record the average of the two measurements. To check your blood pressure when you are not at a hospital or clinic, you can use:  An automated blood pressure machine at a pharmacy.  A home blood pressure monitor.  If you are between 45 years and 39 years old, ask your health care provider if you should take aspirin to prevent strokes.  Have regular diabetes screenings. This involves taking a blood sample to check your fasting blood sugar level.  If you are at a normal weight and have a low risk for diabetes,  have this test once every three years after 48 years of age.  If you are overweight and have a high risk for diabetes, consider being tested at a younger age or more often. PREVENTING INFECTION  Hepatitis B  If you have a higher risk for hepatitis B, you should be screened for this virus. You are considered at high risk for hepatitis B if:  You were born in a country where hepatitis B is common. Ask your health care provider which countries are considered high risk.  Your parents were born in a high-risk country, and you have not been immunized against hepatitis B (hepatitis B vaccine).  You have HIV or AIDS.  You use needles to inject street drugs.  You live with someone who has hepatitis B.  You have had sex with someone who has hepatitis B.  You get hemodialysis treatment.  You take certain medicines for conditions, including cancer, organ transplantation, and autoimmune conditions. Hepatitis C  Blood testing is recommended for:  Everyone born from [birth year] through 1964.  Anyone with known risk factors for hepatitis C. Sexually transmitted infections (STIs)  You should be screened for sexually transmitted infections (STIs) including gonorrhea and chlamydia if:  You are sexually active and are younger than 48 years of age.  You are older than 48 years of age and your health care provider tells you that you are at risk for this type of infection.  Your sexual activity has changed since you were last screened and you are at an increased risk for chlamydia or gonorrhea. Ask your health care provider if you are at risk.  If you do not have HIV, but are at risk, it may be recommended that you take a prescription medicine daily to prevent HIV infection. This is called pre-exposure prophylaxis (PrEP). You are considered at risk if:  You are sexually active and do not regularly use condoms or know the HIV status of your partner(s).  You take drugs by injection.  You are sexually  active with a partner who has HIV. Talk with your health care provider about whether you are at high risk of being infected with HIV. If you choose to begin PrEP, you should first be tested for HIV. You should then be tested every 3 months for as long as you are taking PrEP.  PREGNANCY   If you are premenopausal and you may become pregnant, ask your health care provider about preconception counseling.  If you may  become pregnant, take 400 to 800 micrograms (mcg) of folic acid every day.  If you want to prevent pregnancy, talk to your health care provider about birth control (contraception). OSTEOPOROSIS AND MENOPAUSE   Osteoporosis is a disease in which the bones lose minerals and strength with aging. This can result in serious bone fractures. Your risk for osteoporosis can be identified using a bone density scan.  If you are 75 years of age or older, or if you are at risk for osteoporosis and fractures, ask your health care provider if you should be screened.  Ask your health care provider whether you should take a calcium or vitamin D supplement to lower your risk for osteoporosis.  Menopause may have certain physical symptoms and risks.  Hormone replacement therapy may reduce some of these symptoms and risks. Talk to your health care provider about whether hormone replacement therapy is right for you.  HOME CARE INSTRUCTIONS   Schedule regular health, dental, and eye exams.  Stay current with your immunizations.   Do not use any tobacco products including cigarettes, chewing tobacco, or electronic cigarettes.  If you are pregnant, do not drink alcohol.  If you are breastfeeding, limit how much and how often you drink alcohol.  Limit alcohol intake to no more than 1 drink per day for nonpregnant women. One drink equals 12 ounces of beer, 5 ounces of wine, or 1 ounces of hard liquor.  Do not use street drugs.  Do not share needles.  Ask your health care provider for help if  you need support or information about quitting drugs.  Tell your health care provider if you often feel depressed.  Tell your health care provider if you have ever been abused or do not feel safe at home.   This information is not intended to replace advice given to you by your health care provider. Make sure you discuss any questions you have with your health care provider.   Document Released: 03/20/2011 Document Revised: 09/25/2014 Document Reviewed: 08/06/2013 Elsevier Interactive Patient Education [employer].

## 2015-07-25 NOTE — Progress Notes (Signed)
Subjective:    Patient ID: Carolyn Woodward, female    DOB: 09/22/1965, 49 y.o.   MRN: [id number] Chief Complaint  Patient presents with  . Annual Exam    HPI   Carolyn Woodward is a delightful 49 yo woman.  She is here today at the behest of Carolyn insuance co to get a full phsycial so Carolyn rates don't increase.  She has been seeing gynecology annually for all prior care. She is seeing Dr. Audie Box and had Carolyn appt 2 mos ago - note and studies from there reviewed in detail and no sig findings other than a new mild leukopenia. Pt denies any h/o prior and no concerning sxs.  She was instructed to have CBC repeated which she would like to have done today.  Req flu shot today, unknown last tetanus. Takes daily one a day women's mvi. No otc meds or other supp. Gets plenty of exercise, active job as well, healthy diet.   History reviewed. No pertinent past medical history. History reviewed. No pertinent past surgical history. Current Outpatient Prescriptions on File Prior to Visit  Medication Sig Dispense Refill  . Multiple Vitamin (MULTIVITAMIN) capsule Take 1 capsule by mouth daily.      . Probiotic Product (PROBIOTIC PO) Take by mouth.    Marland Kitchen scopolamine (TRANSDERM-SCOP, 1.5 MG,) 1 MG/3DAYS Place 1 patch (1.5 mg total) onto the skin every 3 (three) days. During cruise (Patient not taking: Reported on 07/25/2015) 2 patch 0   No current facility-administered medications on file prior to visit.   No Known Allergies Family History  Problem Relation Age of Onset  . Heart attack Maternal Grandfather    Social History   Social History  . Marital Status: Married    Spouse Name: N/A  . Number of Children: N/A  . Years of Education: N/A   Social History Main Topics  . Smoking status: Never Smoker   . Smokeless tobacco: Never Used  . Alcohol Use: 0.0 oz/week    0 Standard drinks or equivalent per week     Comment: rare - not regualr  . Drug Use: No  . Sexual Activity: Yes    Birth Control/  Protection: Condom     Comment: 1st intercourse 49 yo-Fewer than 5 partners   Other Topics Concern  . None   Social History Narrative     Review of Systems  All other systems reviewed and are negative.      Objective:  BP 100/60 mmHg  Pulse 64  Temp(Src) 98.3 F (36.8 C) (Oral)  Resp 18  Ht  (1.651 m)  Wt 118 lb 12.8 oz (53.887 kg)  BMI 19.77 kg/m2  SpO2 99%  LMP 06/28/2015  Physical Exam  Constitutional: She is oriented to person, place, and time. She appears well-developed and well-nourished. No distress.  HENT:  Head: Normocephalic and atraumatic.  Right Ear: Tympanic membrane, external ear and ear canal normal.  Left Ear: Tympanic membrane, external ear and ear canal normal.  Nose: Nose normal. No mucosal edema or rhinorrhea.  Mouth/Throat: Uvula is midline, oropharynx is clear and moist and mucous membranes are normal. No posterior oropharyngeal erythema.  Eyes: Conjunctivae and EOM are normal. Pupils are equal, round, and reactive to light. Right eye exhibits no discharge. Left eye exhibits no discharge. No scleral icterus.  Neck: Normal range of motion. Neck supple. No thyromegaly present.  Cardiovascular: Normal rate, regular rhythm, normal heart sounds and intact distal pulses.   Pulmonary/Chest: Effort normal and  breath sounds normal. No respiratory distress.  Abdominal: Soft. Bowel sounds are normal. There is no tenderness.  Musculoskeletal: She exhibits no edema.  Lymphadenopathy:    She has no cervical adenopathy.  Neurological: She is alert and oriented to person, place, and time. She has normal reflexes.  Skin: Skin is warm and dry. She is not diaphoretic. No erythema.  Psychiatric: She has a normal mood and affect. Carolyn behavior is normal.          Assessment & Plan:   1. Health maintenance examination - pt doing great. No medical concerns at this time. She has Carolyn well woman care through Dr. Audie BoxFontaine is UTD but Carolyn insurance has instructed Carolyn  that she also needs an annual CPE through a general practitioner. Dr. Reynold BowenFontaine's notes reviewed in daily and pt is UTD on screening. Unknown last tetanus so repeat today. (pt's husband, Woodward, and Carolyn Woodward are also pt's of mine)  2. Screening for deficiency anemia   3. Leukopenia - highly suspect benign variation in nml but no prior h/o so repeat. If persists, check hiv and peripheral smear.  4. Encounter for vitamin deficiency screening   5. Screening for thyroid disorder   6. Need for prophylactic vaccination and inoculation against influenza - done today    Orders Placed This Encounter  Procedures  . Flu Vaccine QUAD 36+ mos IM  . Tdap vaccine greater than or equal to 7yo IM  . HIV antibody  . TSH  . Vit D  25 hydroxy (rtn osteoporosis monitoring)  . POCT CBC   Results for orders placed or performed in visit on 07/25/15  HIV antibody  Result Value Ref Range   HIV 1&2 Ab, 4th Generation NONREACTIVE NONREACTIVE  TSH  Result Value Ref Range   TSH 0.868 0.350 - 4.500 uIU/mL  Vit D  25 hydroxy (rtn osteoporosis monitoring)  Result Value Ref Range   Vit D, 25-Hydroxy  30 - 100 ng/mL  POCT CBC  Result Value Ref Range   WBC 4.2 (A) 4.6 - 10.2 K/uL   Lymph, poc 1.2 0.6 - 3.4   POC LYMPH PERCENT 28.8 10 - 50 %L   MID (cbc) 0.3 0 - 0.9   POC MID % 6.2 0 - 12 %M   POC Granulocyte 2.7 2 - 6.9   Granulocyte percent 65.0 37 - 80 %G   RBC 3.97 (A) 4.04 - 5.48 M/uL   Hemoglobin 12.2 12.2 - 16.2 g/dL   HCT, POC 16.136.0 (A) 09.637.7 - 47.9 %   MCV 90.6 80 - 97 fL   MCH, POC 30.7 27 - 31.2 pg   MCHC 33.9 31.8 - 35.4 g/dL   RDW, POC 04.514.0 %   Platelet Count, POC 173 142 - 424 K/uL   MPV 7.9 0 - 99.8 fL     Norberto SorensonEva Jenaye Rickert, MD MPH

## 2015-07-26 LAB — VITAMIN D 25 HYDROXY (VIT D DEFICIENCY, FRACTURES): VIT D 25 HYDROXY: 52 ng/mL (ref 30–100)

## 2016-08-07 ENCOUNTER — Other Ambulatory Visit: Payer: Self-pay | Admitting: Gynecology

## 2016-08-07 DIAGNOSIS — Z1231 Encounter for screening mammogram for malignant neoplasm of breast: Secondary | ICD-10-CM

## 2016-08-12 ENCOUNTER — Ambulatory Visit (INDEPENDENT_AMBULATORY_CARE_PROVIDER_SITE_OTHER): Payer: BLUE CROSS/BLUE SHIELD | Admitting: Family Medicine

## 2016-08-12 VITALS — BP 102/66 | HR 79 | Temp 98.9°F | Resp 18 | Ht 65.0 in | Wt 113.8 lb

## 2016-08-12 DIAGNOSIS — D72819 Decreased white blood cell count, unspecified: Secondary | ICD-10-CM | POA: Diagnosis not present

## 2016-08-12 DIAGNOSIS — Z1383 Encounter for screening for respiratory disorder NEC: Secondary | ICD-10-CM | POA: Diagnosis not present

## 2016-08-12 DIAGNOSIS — Z1389 Encounter for screening for other disorder: Secondary | ICD-10-CM

## 2016-08-12 DIAGNOSIS — Z1329 Encounter for screening for other suspected endocrine disorder: Secondary | ICD-10-CM | POA: Diagnosis not present

## 2016-08-12 DIAGNOSIS — Z130 Encounter for screening for diseases of the blood and blood-forming organs and certain disorders involving the immune mechanism: Secondary | ICD-10-CM

## 2016-08-12 DIAGNOSIS — Z0000 Encounter for general adult medical examination without abnormal findings: Secondary | ICD-10-CM

## 2016-08-12 DIAGNOSIS — Z136 Encounter for screening for cardiovascular disorders: Secondary | ICD-10-CM | POA: Diagnosis not present

## 2016-08-12 LAB — POCT URINALYSIS DIP (MANUAL ENTRY)
BILIRUBIN UA: NEGATIVE
GLUCOSE UA: NEGATIVE
Ketones, POC UA: NEGATIVE
Leukocytes, UA: NEGATIVE
Nitrite, UA: NEGATIVE
PH UA: 5
Protein Ur, POC: NEGATIVE
RBC UA: NEGATIVE
SPEC GRAV UA: 1.01
UROBILINOGEN UA: 0.2

## 2016-08-12 LAB — LIPID PANEL
CHOL/HDL RATIO: 1.6 ratio (ref <5.0)
CHOLESTEROL: 154 mg/dL (ref <200)
HDL: 98 mg/dL (ref >50)
LDL CALC: 46 mg/dL (ref <100)
TRIGLYCERIDES: 51 mg/dL (ref <150)
VLDL: 10 mg/dL (ref <30)

## 2016-08-12 LAB — CBC
HEMATOCRIT: 36.5 % (ref 35.0–45.0)
HEMOGLOBIN: 12.3 g/dL (ref 11.7–15.5)
MCH: 30.7 pg (ref 27.0–33.0)
MCHC: 33.7 g/dL (ref 32.0–36.0)
MCV: 91 fL (ref 80.0–100.0)
MPV: 10.5 fL (ref 7.5–12.5)
Platelets: 203 10*3/uL (ref 140–400)
RBC: 4.01 MIL/uL (ref 3.80–5.10)
RDW: 13.9 % (ref 11.0–15.0)
WBC: 3.1 10*3/uL — AB (ref 3.8–10.8)

## 2016-08-12 LAB — COMPREHENSIVE METABOLIC PANEL
ALT: 35 U/L — ABNORMAL HIGH (ref 6–29)
AST: 42 U/L — ABNORMAL HIGH (ref 10–35)
Albumin: 4.1 g/dL (ref 3.6–5.1)
Alkaline Phosphatase: 81 U/L (ref 33–115)
BUN: 11 mg/dL (ref 7–25)
CHLORIDE: 106 mmol/L (ref 98–110)
CO2: 24 mmol/L (ref 20–31)
CREATININE: 0.77 mg/dL (ref 0.50–1.10)
Calcium: 9 mg/dL (ref 8.6–10.2)
Glucose, Bld: 96 mg/dL (ref 65–99)
POTASSIUM: 4.2 mmol/L (ref 3.5–5.3)
SODIUM: 140 mmol/L (ref 135–146)
Total Bilirubin: 1.2 mg/dL (ref 0.2–1.2)
Total Protein: 6.4 g/dL (ref 6.1–8.1)

## 2016-08-12 LAB — TSH: TSH: 1.5 mIU/L

## 2016-08-12 NOTE — Progress Notes (Signed)
Subjective:  By signing my name below, I, Carolyn Woodward, attest that this documentation has been prepared under the direction and in the presence of Carolyn SorensonEva Shaw, MD Electronically Signed: Charline BillsEssence Woodward, ED Scribe 08/12/2016 at 9:25 AM.   Patient ID: Carolyn Woodward P Woodward, female    DOB: 12/13/1965, 50 y.o.   MRN: [id number] Chief Complaint  Patient presents with  . Annual Exam   HPI HPI Comments: Carolyn Woodward P [place] is a 50 y.o. female who presents to the [facility] for an annual exam. Pt was seen 1 year prior for same. She follows with Dr. Audie BoxFontaine for well woman care. Tetanus done 2016. 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 Lipid panel done last year was fantastic. Normal pap with negative high risk HPV of 7.15.   Pt is taking multi-vitamins. She is lactose intolerant but consumes almond milk. [place]. She started running at 50 y.o.   Preventative Maintenance  Pt states that her menstrual period occasionally skips a month but she has discussed this with her GYN Dr. Audie BoxFontaine who states that this is normal due to her age and physical activity. Pt's mammogram is scheduled for 08/30/16. She denies hot flashes, vaginal discharge, vaginal itching, rash.   Her last eye exam was 2 years ago but plans to schedule an appointment soon.   Immunizations She received her flu vaccine in October.  Immunization History  Administered Date(s) Administered  . Influenza,inj,Quad PF,36+ Mos 07/25/2015  . Influenza-Unspecified 06/28/2016  . Tdap 07/25/2015     Her husband is Carolyn Woodward and son is Carolyn Woodward - my pts as well.   History reviewed. No pertinent past medical history. Current Outpatient Prescriptions on File Prior to Visit  Medication Sig Dispense Refill  . Multiple Vitamin (MULTIVITAMIN) capsule Take 1 capsule by mouth daily.      . Probiotic Product (PROBIOTIC PO) Take by mouth.    Marland Kitchen. scopolamine (TRANSDERM-SCOP, 1.5  MG,) 1 MG/3DAYS Place 1 patch (1.5 mg total) onto the skin every 3 (three) days. During cruise (Patient not taking: Reported on 08/12/2016) 2 patch 0   No current facility-administered medications on file prior to visit.    No Known Allergies  History reviewed. No pertinent surgical history. Family History  Problem Relation Age of Onset  . Heart attack Maternal Grandfather    Social History   Social History  . Marital status: Married    Spouse name: N/A  . Number of children: N/A  . Years of education: N/A   Social History Main Topics  . Smoking status: Never Smoker  . Smokeless tobacco: Never Used  . Alcohol use 0.0 oz/week     Comment: rare - not regualr  . Drug use: No  . Sexual activity: Yes    Birth control/ protection: Condom     Comment: 1st intercourse 50 yo-Fewer than 5 partners   Other Topics Concern  . None   Social History Narrative  . None   [facility]  Decreased Interest 0  Down, Depressed, Hopeless 0  PHQ - 2 Score 0    Review of Systems  Genitourinary: Negative for vaginal discharge.  Skin: Negative for rash.  All other systems reviewed and are negative.  BP 102/66 (BP Location: Right Arm, Patient Position: Sitting, Cuff Size: Small)   Pulse 79   Temp 98.9 F (37.2 C) (Oral)   Resp 18   Ht 5\' 5"  (1.651 m)  Wt 113 lb 12.8 oz (51.6 kg)   LMP 07/14/2016   SpO2 99%   BMI 18.94 kg/m     Objective:   Physical Exam  Constitutional: She is oriented to person, place, and time. She appears well-developed and well-nourished. No distress.  HENT:  Head: Normocephalic and atraumatic.  Right Ear: Tympanic membrane normal.  Left Ear: Tympanic membrane normal.  Nose: Nose normal.  Mouth/Throat: Oropharynx is clear and moist.  Eyes: Conjunctivae and EOM are normal.  Neck: Neck supple. No tracheal deviation present. No thyromegaly present.  Cardiovascular: Normal rate, regular rhythm, S1 normal, S2 normal and normal heart  sounds.   Pulmonary/Chest: Effort normal and breath sounds normal. No respiratory distress.  Musculoskeletal: Normal range of motion.  Lymphadenopathy:    She has no cervical adenopathy.  Neurological: She is alert and oriented to person, place, and time.  Skin: Skin is warm and dry.  Psychiatric: She has a normal mood and affect. Her behavior is normal.  Nursing note and vitals reviewed.     Assessment & Plan:   1. Annual physical exam   2. Screening for cardiovascular, respiratory, and genitourinary diseases   3. Screening for deficiency anemia   4. Screening for thyroid disorder   5. Leukopenia, unspecified type     Orders Placed This Encounter  Procedures  . Comprehensive metabolic panel    Order Specific Question:   Has the patient fasted?    Answer:   Yes  . CBC  . TSH  . Lipid panel    Order Specific Question:   Has the patient fasted?    Answer:   Yes  . Pathologist smear review  . HIV antibody  . Care order/instruction:    AVS printed - let patient go!  Marland Kitchen. POCT urinalysis dipstick     I personally performed the services described in this documentation, which was scribed in my presence. The recorded information has been reviewed and considered, and addended by me as needed.   Carolyn Woodward, M.D.  [facility]  [place] [address] [place], [place] [address] [phone] phone [phone] fax  08/15/16 1:02 AM  Results for orders placed or performed in visit on 08/12/16  Comprehensive metabolic panel  Result Value Ref Range   Sodium 140 135 - 146 mmol/L   Potassium 4.2 3.5 - 5.3 mmol/L   Chloride 106 98 - 110 mmol/L   CO2 24 20 - 31 mmol/L   Glucose, Bld 96 65 - 99 mg/dL   BUN 11 7 - 25 mg/dL   Creat 1.300.77 8.650.50 - 7.841.10 mg/dL   Total Bilirubin 1.2 0.2 - 1.2 mg/dL   Alkaline Phosphatase 81 33 - 115 U/L   AST 42 (H) 10 - 35 U/L   ALT 35 (H) 6 - 29 U/L   Total Protein 6.4 6.1 - 8.1 g/dL   Albumin 4.1 3.6 - 5.1 g/dL   Calcium 9.0 8.6  - 69.610.2 mg/dL  CBC  Result Value Ref Range   WBC 3.1 (L) 3.8 - 10.8 K/uL   RBC 4.01 3.80 - 5.10 MIL/uL   Hemoglobin 12.3 11.7 - 15.5 g/dL   HCT 29.536.5 28.435.0 - 13.245.0 %   MCV 91.0 80.0 - 100.0 fL   MCH 30.7 27.0 - 33.0 pg   MCHC 33.7 32.0 - 36.0 g/dL   RDW 44.013.9 10.211.0 - 72.515.0 %   Platelets 203 140 - 400 K/uL   MPV 10.5 7.5 - 12.5 fL  TSH  Result Value Ref Range   TSH 1.50 mIU/L  Lipid panel  Result Value Ref Range   Cholesterol 154 <200 mg/dL   Triglycerides 51 <956 mg/dL   HDL 98 >21 mg/dL   Total CHOL/HDL Ratio 1.6 <5.0 Ratio   VLDL 10 <30 mg/dL   LDL Cholesterol 46 <308 mg/dL  Pathologist smear review  Result Value Ref Range   Path Review SEE NOTE   HIV antibody  Result Value Ref Range   HIV 1&2 Ab, 4th Generation NONREACTIVE NONREACTIVE  POCT urinalysis dipstick  Result Value Ref Range   Color, UA yellow yellow   Clarity, UA clear clear   Glucose, UA negative negative   Bilirubin, UA negative negative   Ketones, POC UA negative negative   Spec Grav, UA 1.010    Blood, UA negative negative   pH, UA 5.0    Protein Ur, POC negative negative   Urobilinogen, UA 0.2    Nitrite, UA Negative Negative   Leukocytes, UA Negative Negative

## 2016-08-12 NOTE — Patient Instructions (Addendum)
IF you received an x-ray today, you will receive an invoice from [facility]. Please contact [facility] at [phone] with questions or concerns regarding your invoice.   IF you received labwork today, you will receive an invoice from [employer]. Please contact [facility] at [phone] with questions or concerns regarding your invoice.   Our billing staff will not be able to assist you with questions regarding bills from these companies.  You will be contacted with the lab results as soon as they are available. The fastest way to get your results is to activate your My Chart account. Instructions are located on the last page of this paperwork. If you have not heard from [place] regarding the results in 2 weeks, please contact this office.     Health Maintenance for Postmenopausal Women Introduction Menopause is a normal process in which your reproductive ability comes to an end. This process happens gradually over a span of months to years, usually between the ages of 29 and 26. Menopause is complete when you have missed 12 consecutive menstrual periods. It is important to talk with your health care provider about some of the most common conditions that affect postmenopausal women, such as heart disease, cancer, and bone loss (osteoporosis). Adopting a healthy lifestyle and getting preventive care can help to promote your health and wellness. Those actions can also lower your chances of developing some of these common conditions. What should I know about menopause? During menopause, you may experience a number of symptoms, such as:  Moderate-to-severe hot flashes.  Night sweats.  Decrease in sex drive.  Mood swings.  Headaches.  Tiredness.  Irritability.  Memory problems.  Insomnia. Choosing to treat or not to treat menopausal changes is an individual decision that you make with your health care provider. What should I know about  hormone replacement therapy and supplements? Hormone therapy products are effective for treating symptoms that are associated with menopause, such as hot flashes and night sweats. Hormone replacement carries certain risks, especially as you become older. If you are thinking about using estrogen or estrogen with progestin treatments, discuss the benefits and risks with your health care provider. What should I know about heart disease and stroke? Heart disease, heart attack, and stroke become more likely as you age. This may be due, in part, to the hormonal changes that your body experiences during menopause. These can affect how your body processes dietary fats, triglycerides, and cholesterol. Heart attack and stroke are both medical emergencies. There are many things that you can do to help prevent heart disease and stroke:  Have your blood pressure checked at least every 1-2 years. High blood pressure causes heart disease and increases the risk of stroke.  If you are 59-58 years old, ask your health care provider if you should take aspirin to prevent a heart attack or a stroke.  Do not use any tobacco products, including cigarettes, chewing tobacco, or electronic cigarettes. If you need help quitting, ask your health care provider.  It is important to eat a healthy diet and maintain a healthy weight.  Be sure to include plenty of vegetables, fruits, low-fat dairy products, and lean protein.  Avoid eating foods that are high in solid fats, added sugars, or salt (sodium).  Get regular exercise. This is one of the most important things that you can do for your health.  Try to exercise for at least 150 minutes each week. The type of exercise that you do  should increase your heart rate and make you sweat. This is known as moderate-intensity exercise.  Try to do strengthening exercises at least twice each week. Do these in addition to the moderate-intensity exercise.  Know your numbers.Ask your  health care provider to check your cholesterol and your blood glucose. Continue to have your blood tested as directed by your health care provider. What should I know about cancer screening? There are several types of cancer. Take the following steps to reduce your risk and to catch any cancer development as early as possible. Breast Cancer  Practice breast self-awareness.  This means understanding how your breasts normally appear and feel.  It also means doing regular breast self-exams. Let your health care provider know about any changes, no matter how small.  If you are 30 or older, have a clinician do a breast exam (clinical breast exam or CBE) every year. Depending on your age, family history, and medical history, it may be recommended that you also have a yearly breast X-ray (mammogram).  If you have a family history of breast cancer, talk with your health care provider about genetic screening.  If you are at high risk for breast cancer, talk with your health care provider about having an MRI and a mammogram every year.  Breast cancer (BRCA) gene test is recommended for women who have family members with BRCA-related cancers. Results of the assessment will determine the need for genetic counseling and BRCA1 and for BRCA2 testing. BRCA-related cancers include these types:  Breast. This occurs in males or females.  Ovarian.  Tubal. This may also be called fallopian tube cancer.  Cancer of the abdominal or pelvic lining (peritoneal cancer).  Prostate.  Pancreatic. Cervical, Uterine, and Ovarian Cancer  Your health care provider may recommend that you be screened regularly for cancer of the pelvic organs. These include your ovaries, uterus, and vagina. This screening involves a pelvic exam, which includes checking for microscopic changes to the surface of your cervix (Pap test).  For women ages 21-65, health care providers may recommend a pelvic exam and a Pap test every three  years. For women ages 77-65, they may recommend the Pap test and pelvic exam, combined with testing for human papilloma virus (HPV), every five years. Some types of HPV increase your risk of cervical cancer. Testing for HPV may also be done on women of any age who have unclear Pap test results.  Other health care providers may not recommend any screening for nonpregnant women who are considered low risk for pelvic cancer and have no symptoms. Ask your health care provider if a screening pelvic exam is right for you.  If you have had past treatment for cervical cancer or a condition that could lead to cancer, you need Pap tests and screening for cancer for at least 20 years after your treatment. If Pap tests have been discontinued for you, your risk factors (such as having a new sexual partner) need to be reassessed to determine if you should start having screenings again. Some women have medical problems that increase the chance of getting cervical cancer. In these cases, your health care provider may recommend that you have screening and Pap tests more often.  If you have a family history of uterine cancer or ovarian cancer, talk with your health care provider about genetic screening.  If you have vaginal bleeding after reaching menopause, tell your health care provider.  There are currently no reliable tests available to screen for ovarian cancer.  Lung Cancer  Lung cancer screening is recommended for adults 55-80 years old who are at high risk for lung cancer because of a history of smoking. A yearly low-dose CT scan of the lungs is recommended if you:  Currently smoke.  Have a history of at least 30 pack-years of smoking and you currently smoke or have quit within the past 15 years. A pack-year is smoking an average of one pack of cigarettes per day for one year. Yearly screening should:  Continue until it has been 15 years since you quit.  Stop if you develop a health problem that would  prevent you from having lung cancer treatment. Colorectal Cancer  This type of cancer can be detected and can often be prevented.  Routine colorectal cancer screening usually begins at age 50 and continues through age 75.  If you have risk factors for colon cancer, your health care provider may recommend that you be screened at an earlier age.  If you have a family history of colorectal cancer, talk with your health care provider about genetic screening.  Your health care provider may also recommend using home test kits to check for hidden blood in your stool.  A small camera at the end of a tube can be used to examine your colon directly (sigmoidoscopy or colonoscopy). This is done to check for the earliest forms of colorectal cancer.  Direct examination of the colon should be repeated every 5-10 years until age 75. However, if early forms of precancerous polyps or small growths are found or if you have a family history or genetic risk for colorectal cancer, you may need to be screened more often. Skin Cancer  Check your skin from head to toe regularly.  Monitor any moles. Be sure to tell your health care provider:  About any new moles or changes in moles, especially if there is a change in a mole's shape or color.  If you have a mole that is larger than the size of a pencil eraser.  If any of your family members has a history of skin cancer, especially at a young age, talk with your health care provider about genetic screening.  Always use sunscreen. Apply sunscreen liberally and repeatedly throughout the day.  Whenever you are outside, protect yourself by wearing long sleeves, pants, a wide-brimmed hat, and sunglasses. What should I know about osteoporosis? Osteoporosis is a condition in which bone destruction happens more quickly than new bone creation. After menopause, you may be at an increased risk for osteoporosis. To help prevent osteoporosis or the bone fractures that can  happen because of osteoporosis, the following is recommended:  If you are 19-50 years old, get at least 1,000 mg of calcium and at least 600 mg of vitamin D per day.  If you are older than age 50 but younger than age 70, get at least 1,200 mg of calcium and at least 600 mg of vitamin D per day.  If you are older than age 70, get at least 1,200 mg of calcium and at least 800 mg of vitamin D per day. Smoking and excessive alcohol intake increase the risk of osteoporosis. Eat foods that are rich in calcium and vitamin D, and do weight-bearing exercises several times each week as directed by your health care provider. What should I know about how menopause affects my mental health? Depression may occur at any age, but it is more common as you become older. Common symptoms of depression include:  Low   or sad mood.  Changes in sleep patterns.  Changes in appetite or eating patterns.  Feeling an overall lack of motivation or enjoyment of activities that you previously enjoyed.  Frequent crying spells. Talk with your health care provider if you think that you are experiencing depression. What should I know about immunizations? It is important that you get and maintain your immunizations. These include:  Tetanus, diphtheria, and pertussis (Tdap) booster vaccine.  Influenza every year before the flu season begins.  Pneumonia vaccine.  Shingles vaccine. Your health care provider may also recommend other immunizations. This information is not intended to replace advice given to you by your health care provider. Make sure you discuss any questions you have with your health care provider. Document Released: 10/27/2005 Document Revised: 03/24/2016 Document Reviewed: 06/08/2015  2017 [employer]

## 2016-08-14 LAB — HIV ANTIBODY (ROUTINE TESTING W REFLEX): HIV: NONREACTIVE

## 2016-08-14 LAB — PATHOLOGIST SMEAR REVIEW

## 2016-08-30 ENCOUNTER — Ambulatory Visit
Admission: RE | Admit: 2016-08-30 | Discharge: 2016-08-30 | Disposition: A | Discharge status: Home / Self Care | Payer: BLUE CROSS/BLUE SHIELD | Source: Ambulatory Visit | Attending: Gynecology | Admitting: Gynecology

## 2016-08-30 DIAGNOSIS — Z1231 Encounter for screening mammogram for malignant neoplasm of breast: Secondary | ICD-10-CM

## 2017-08-03 ENCOUNTER — Other Ambulatory Visit: Payer: Self-pay | Admitting: Family Medicine

## 2017-08-03 DIAGNOSIS — Z1231 Encounter for screening mammogram for malignant neoplasm of breast: Secondary | ICD-10-CM

## 2017-08-25 ENCOUNTER — Other Ambulatory Visit: Payer: Self-pay

## 2017-08-25 ENCOUNTER — Ambulatory Visit (INDEPENDENT_AMBULATORY_CARE_PROVIDER_SITE_OTHER): Payer: BLUE CROSS/BLUE SHIELD | Admitting: Family Medicine

## 2017-08-25 ENCOUNTER — Encounter: Payer: Self-pay | Admitting: Family Medicine

## 2017-08-25 VITALS — BP 102/68 | HR 64 | Temp 98.0°F | Resp 16 | Ht 65.16 in | Wt 114.0 lb

## 2017-08-25 DIAGNOSIS — Z136 Encounter for screening for cardiovascular disorders: Secondary | ICD-10-CM

## 2017-08-25 DIAGNOSIS — Z0000 Encounter for general adult medical examination without abnormal findings: Secondary | ICD-10-CM

## 2017-08-25 DIAGNOSIS — N926 Irregular menstruation, unspecified: Secondary | ICD-10-CM

## 2017-08-25 DIAGNOSIS — Z1211 Encounter for screening for malignant neoplasm of colon: Secondary | ICD-10-CM | POA: Diagnosis not present

## 2017-08-25 DIAGNOSIS — Z1383 Encounter for screening for respiratory disorder NEC: Secondary | ICD-10-CM

## 2017-08-25 DIAGNOSIS — Z1231 Encounter for screening mammogram for malignant neoplasm of breast: Secondary | ICD-10-CM

## 2017-08-25 DIAGNOSIS — Z1389 Encounter for screening for other disorder: Secondary | ICD-10-CM | POA: Diagnosis not present

## 2017-08-25 DIAGNOSIS — Z23 Encounter for immunization: Secondary | ICD-10-CM

## 2017-08-25 DIAGNOSIS — Z1212 Encounter for screening for malignant neoplasm of rectum: Secondary | ICD-10-CM | POA: Diagnosis not present

## 2017-08-25 DIAGNOSIS — Z130 Encounter for screening for diseases of the blood and blood-forming organs and certain disorders involving the immune mechanism: Secondary | ICD-10-CM

## 2017-08-25 LAB — POCT URINALYSIS DIP (MANUAL ENTRY)
Bilirubin, UA: NEGATIVE
GLUCOSE UA: NEGATIVE mg/dL
NITRITE UA: NEGATIVE
PH UA: 6 (ref 5.0–8.0)
PROTEIN UA: NEGATIVE mg/dL
RBC UA: NEGATIVE
SPEC GRAV UA: 1.02 (ref 1.010–1.025)
UROBILINOGEN UA: 0.2 U/dL

## 2017-08-25 NOTE — Progress Notes (Signed)
Subjective:  By signing my name below, I, Carolyn Woodward, attest that this documentation has been prepared under the direction and in the presence of Norberto SorensonEva Shaw, MD Electronically Signed: Charline BillsEssence Woodward, ED Scribe 08/25/2017 at 8:26 AM.   Patient ID: Carolyn Woodward, female    DOB: 04/22/1966, 51 y.o.   MRN: [id number]  Chief Complaint  Patient presents with  . Annual Exam   HPI Carolyn CohoLien P Pembleton is a 51 y.o. female who presents to [facility] for an annual exam. Pt is fasting at this visit.  Primary Preventative Screenings: Cervical Cancer: pap smear: [facility] 04[facility]/14, neg High risk HPV. Next pap scheduled for 10/01/17. Pt has noticed that her menstrual period is starting to skip 3-4 months. States she still has symptoms although she will not have an actual period. Denies having significant hot flashes. Plans to discuss this further with Dr. Audie BoxFontaine.  STI screening: HIV neg 2017 Breast Cancer: [facility]. Next mam scheduled for 08/31/17. Colorectal Cancer: pt wants to defer colonoscopy at this time but agrees to do cologuard Tobacco use/EtOH/substances: Bone Density: Vitamin D 52 in 2016.  Cardiac: Weight/Blood sugar/Diet/Exercise: Drinks soy milk and eats cheese. Pt runs for exercise, including numerous 5Ks and half marathons. BMI Readings from Last 3 Encounters:  08/25/17 18.88 kg/m  08/12/16 18.94 kg/m  07/25/15 19.77 kg/m   No results found for: HGBA1C OTC/Vit/Supp/Herbal: takes multivitamin and probiotic Dentist/Optho: sees regularly  Immunizations: will receive flu vaccine today Immunization History  Administered Date(s) Administered  . Influenza,inj,Quad PF,6+ Mos 07/25/2015  . Influenza-Unspecified 06/28/2016  . Tdap 07/25/2015   Chronic Medical Conditions: Chronic leukopenia with a normal smear last yr. Suspected to be congenital. Mild LFT elevation though lipid panel was wonderful.   History reviewed.  No pertinent past medical history. Current Outpatient Medications on File Prior to Visit  Medication Sig Dispense Refill  . Multiple Vitamin (MULTIVITAMIN) capsule Take 1 capsule by mouth daily.      . Probiotic Product (PROBIOTIC PO) Take by mouth.     No current facility-administered medications on file prior to visit.    No Known Allergies   History reviewed. No pertinent surgical history. Family History  Problem Relation Age of Onset  . Heart attack Maternal Grandfather    Social History   Socioeconomic History  . Marital status: Married    Spouse name: None  . Number of children: None  . Years of education: None  . Highest education level: None  Social Needs  . Financial resource strain: None  . Food insecurity - worry: None  . Food insecurity - inability: None  . Transportation needs - medical: None  . Transportation needs - non-medical: None  Occupational History  . None  Tobacco Use  . Smoking status: Never Smoker  . Smokeless tobacco: Never Used  Substance and Sexual Activity  . Alcohol use: Yes    Alcohol/week: 0.0 oz    Comment: rare - not regualr  . Drug use: No  . Sexual activity: Yes    Birth control/protection: Condom    Comment: 1st intercourse 51 yo-Fewer than 5 partners  Other Topics Concern  . None  Social History Narrative  . None   [facility] 08/25/2017 08/12/2016  Decreased Interest 0 0  Down, Depressed, Hopeless 0 0  PHQ - 2 Score 0 0    Review of Systems  Constitutional: Negative for diaphoresis.  Endocrine: Negative for  heat intolerance.  All other systems reviewed and are negative.     Objective:   Physical Exam  Constitutional: She is oriented to person, place, and time. She appears well-developed and well-nourished. No distress.  HENT:  Head: Normocephalic and atraumatic.  Eyes: Conjunctivae and EOM are normal.  Neck: Neck supple. No tracheal deviation present. No thyroid mass and no thyromegaly present.    Cardiovascular: Normal rate, regular rhythm and normal heart sounds.  Pulmonary/Chest: Effort normal and breath sounds normal. No respiratory distress.  Lungs are clear  Musculoskeletal: Normal range of motion.  Neurological: She is alert and oriented to person, place, and time.  Skin: Skin is warm and dry.  Psychiatric: She has a normal mood and affect. Her behavior is normal.  Nursing note and vitals reviewed.  BP 102/68   Pulse 64   Temp 98 F (36.7 C) (Oral)   Resp 16   Ht 5' 5.16" (1.655 m)   Wt 114 lb (51.7 kg)   SpO2 98%   BMI 18.88 kg/m     Results for orders placed or performed in visit on 08/25/17  POCT urinalysis dipstick  Result Value Ref Range   Color, UA yellow yellow   Clarity, UA clear clear   Glucose, UA negative negative mg/dL   Bilirubin, UA negative negative   Ketones, POC UA trace (5) (A) negative mg/dL   Spec Grav, UA 2.1301.020 [id number] - 1.025   Blood, UA negative negative   pH, UA 6.0 5.0 - 8.0   Protein Ur, POC negative negative mg/dL   Urobilinogen, UA 0.2 0.2 or 1.0 E.U./dL   Nitrite, UA Negative Negative   Leukocytes, UA Trace (A) Negative   EKG: NSR, no acute ischemic changes noted. No prior EKG available for comparison.   I have personally reviewed the EKG tracing and agree with the computer interpretation. Sinus  Rhythm  -  Negative precordial T-waves.   WITHIN NORMAL LIMITS   Assessment & Plan:   1. Annual physical exam   2. Encounter for screening mammogram for breast cancer   3. Screening for cardiovascular, respiratory, and genitourinary diseases   4. Screening for colorectal cancer   5. Screening for deficiency anemia   6. Need for prophylactic vaccination and inoculation against influenza   7. Irregular menses     Orders Placed This Encounter  Procedures  . Flu Vaccine QUAD 36+ mos IM  . CBC with Differential/Platelet  . Comprehensive metabolic panel    Order Specific Question:   Has the patient fasted?    Answer:   No  .  Lipid panel    Order Specific Question:   Has the patient fasted?    Answer:   No  . Cologuard  . Follicle Stimulating Hormone  . Follicle stimulating hormone  . POCT urinalysis dipstick  . EKG 12-Lead     I personally performed the services described in this documentation, which was scribed in my presence. The recorded information has been reviewed and considered, and addended by me as needed.   Norberto SorensonEva Shaw, M.D.  Primary [facility] at [facility]  [place] [address] [place], [place] [address] [phone] phone [phone] fax  08/27/17 3:43 PM

## 2017-08-25 NOTE — Patient Instructions (Addendum)
   IF you received an x-ray today, you will receive an invoice from [facility]. Please contact [facility] at [phone] with questions or concerns regarding your invoice.   IF you received labwork today, you will receive an invoice from [facility]. Please contact [facility] at [phone] with questions or concerns regarding your invoice.   Our billing staff will not be able to assist you with questions regarding bills from these companies.  You will be contacted with the lab results as soon as they are available. The fastest way to get your results is to activate your My Chart account. Instructions are located on the last page of this paperwork. If you have not heard from us regarding the results in 2 weeks, please contact this office.     Health Maintenance for Postmenopausal Women Menopause is a normal process in which your reproductive ability comes to an end. This process happens gradually over a span of months to years, usually between the ages of 48 and 55. Menopause is complete when you have missed 12 consecutive menstrual periods. It is important to talk with your health care provider about some of the most common conditions that affect postmenopausal women, such as heart disease, cancer, and bone loss (osteoporosis). Adopting a healthy lifestyle and getting preventive care can help to promote your health and wellness. Those actions can also lower your chances of developing some of these common conditions. What should I know about menopause? During menopause, you may experience a number of symptoms, such as:  Moderate-to-severe hot flashes.  Night sweats.  Decrease in sex drive.  Mood swings.  Headaches.  Tiredness.  Irritability.  Memory problems.  Insomnia.  Choosing to treat or not to treat menopausal changes is an individual decision that you make with your health care provider. What should I know about hormone replacement therapy and  supplements? Hormone therapy products are effective for treating symptoms that are associated with menopause, such as hot flashes and night sweats. Hormone replacement carries certain risks, especially as you become older. If you are thinking about using estrogen or estrogen with progestin treatments, discuss the benefits and risks with your health care provider. What should I know about heart disease and stroke? Heart disease, heart attack, and stroke become more likely as you age. This may be due, in part, to the hormonal changes that your body experiences during menopause. These can affect how your body processes dietary fats, triglycerides, and cholesterol. Heart attack and stroke are both medical emergencies. There are many things that you can do to help prevent heart disease and stroke:  Have your blood pressure checked at least every 1-2 years. High blood pressure causes heart disease and increases the risk of stroke.  If you are 50-79 years old, ask your health care provider if you should take aspirin to prevent a heart attack or a stroke.  Do not use any tobacco products, including cigarettes, chewing tobacco, or electronic cigarettes. If you need help quitting, ask your health care provider.  It is important to eat a healthy diet and maintain a healthy weight. ? Be sure to include plenty of vegetables, fruits, low-fat dairy products, and lean protein. ? Avoid eating foods that are high in solid fats, added sugars, or salt (sodium).  Get regular exercise. This is one of the most important things that you can do for your health. ? Try to exercise for at least 150 minutes each week. The type of exercise that you do should increase your   your heart rate and make you sweat. This is known as moderate-intensity exercise. ? Try to do strengthening exercises at least twice each week. Do these in addition to the moderate-intensity exercise.  Know your numbers.Ask your health care provider to check  your cholesterol and your blood glucose. Continue to have your blood tested as directed by your health care provider.  What should I know about cancer screening? There are several types of cancer. Take the following steps to reduce your risk and to catch any cancer development as early as possible. Breast Cancer  Practice breast self-awareness. ? This means understanding how your breasts normally appear and feel. ? It also means doing regular breast self-exams. Let your health care provider know about any changes, no matter how small.  If you are 50 or older, have a clinician do a breast exam (clinical breast exam or CBE) every year. Depending on your age, family history, and medical history, it may be recommended that you also have a yearly breast X-ray (mammogram).  If you have a family history of breast cancer, talk with your health care provider about genetic screening.  If you are at high risk for breast cancer, talk with your health care provider about having an MRI and a mammogram every year.  Breast cancer (BRCA) gene test is recommended for women who have family members with BRCA-related cancers. Results of the assessment will determine the need for genetic counseling and BRCA1 and for BRCA2 testing. BRCA-related cancers include these types: ? Breast. This occurs in males or females. ? Ovarian. ? Tubal. This may also be called fallopian tube cancer. ? Cancer of the abdominal or pelvic lining (peritoneal cancer). ? Prostate. ? Pancreatic.  Cervical, Uterine, and Ovarian Cancer Your health care provider may recommend that you be screened regularly for cancer of the pelvic organs. These include your ovaries, uterus, and vagina. This screening involves a pelvic exam, which includes checking for microscopic changes to the surface of your cervix (Pap test).  For women ages 21-65, health care providers may recommend a pelvic exam and a Pap test every three years. For women ages 50-65,  they may recommend the Pap test and pelvic exam, combined with testing for human papilloma virus (HPV), every five years. Some types of HPV increase your risk of cervical cancer. Testing for HPV may also be done on women of any age who have unclear Pap test results.  Other health care providers may not recommend any screening for nonpregnant women who are considered low risk for pelvic cancer and have no symptoms. Ask your health care provider if a screening pelvic exam is right for you.  If you have had past treatment for cervical cancer or a condition that could lead to cancer, you need Pap tests and screening for cancer for at least 20 years after your treatment. If Pap tests have been discontinued for you, your risk factors (such as having a new sexual partner) need to be reassessed to determine if you should start having screenings again. Some women have medical problems that increase the chance of getting cervical cancer. In these cases, your health care provider may recommend that you have screening and Pap tests more often.  If you have a family history of uterine cancer or ovarian cancer, talk with your health care provider about genetic screening.  If you have vaginal bleeding after reaching menopause, tell your health care provider.  There are currently no reliable tests available to screen for ovarian cancer.  Cancer Lung cancer screening is recommended for adults 55-80 years old who are at high risk for lung cancer because of a history of smoking. A yearly low-dose CT scan of the lungs is recommended if you:  Currently smoke.  Have a history of at least 30 pack-years of smoking and you currently smoke or have quit within the past 15 years. A pack-year is smoking an average of one pack of cigarettes per day for one year.  Yearly screening should:  Continue until it has been 15 years since you quit.  Stop if you develop a health problem that would prevent you from having lung  cancer treatment.  Colorectal Cancer  This type of cancer can be detected and can often be prevented.  Routine colorectal cancer screening usually begins at age 50 and continues through age 75.  If you have risk factors for colon cancer, your health care provider may recommend that you be screened at an earlier age.  If you have a family history of colorectal cancer, talk with your health care provider about genetic screening.  Your health care provider may also recommend using home test kits to check for hidden blood in your stool.  A small camera at the end of a tube can be used to examine your colon directly (sigmoidoscopy or colonoscopy). This is done to check for the earliest forms of colorectal cancer.  Direct examination of the colon should be repeated every 5-10 years until age 75. However, if early forms of precancerous polyps or small growths are found or if you have a family history or genetic risk for colorectal cancer, you may need to be screened more often.  Skin Cancer  Check your skin from head to toe regularly.  Monitor any moles. Be sure to tell your health care provider: ? About any new moles or changes in moles, especially if there is a change in a mole's shape or color. ? If you have a mole that is larger than the size of a pencil eraser.  If any of your family members has a history of skin cancer, especially at a young age, talk with your health care provider about genetic screening.  Always use sunscreen. Apply sunscreen liberally and repeatedly throughout the day.  Whenever you are outside, protect yourself by wearing long sleeves, pants, a wide-brimmed hat, and sunglasses.  What should I know about osteoporosis? Osteoporosis is a condition in which bone destruction happens more quickly than new bone creation. After menopause, you may be at an increased risk for osteoporosis. To help prevent osteoporosis or the bone fractures that can happen because of  osteoporosis, the following is recommended:  If you are 19-50 years old, get at least 1,000 mg of calcium and at least 600 mg of vitamin D per day.  If you are older than age 50 but younger than age 70, get at least 1,200 mg of calcium and at least 600 mg of vitamin D per day.  If you are older than age 70, get at least 1,200 mg of calcium and at least 800 mg of vitamin D per day.  Smoking and excessive alcohol intake increase the risk of osteoporosis. Eat foods that are rich in calcium and vitamin D, and do weight-bearing exercises several times each week as directed by your health care provider. What should I know about how menopause affects my mental health? Depression may occur at any age, but it is more common as you become older. Common symptoms of depression   include:  Low or sad mood.  Changes in sleep patterns.  Changes in appetite or eating patterns.  Feeling an overall lack of motivation or enjoyment of activities that you previously enjoyed.  Frequent crying spells.  Talk with your health care provider if you think that you are experiencing depression. What should I know about immunizations? It is important that you get and maintain your immunizations. These include:  Tetanus, diphtheria, and pertussis (Tdap) booster vaccine.  Influenza every year before the flu season begins.  Pneumonia vaccine.  Shingles vaccine.  Your health care provider may also recommend other immunizations. This information is not intended to replace advice given to you by your health care provider. Make sure you discuss any questions you have with your health care provider. Document Released: 10/27/2005 Document Revised: 03/24/2016 Document Reviewed: 06/08/2015 Elsevier Interactive Patient Education  2018 Elsevier Inc.  

## 2017-08-26 LAB — CBC WITH DIFFERENTIAL/PLATELET
BASOS ABS: 0 10*3/uL (ref 0.0–0.2)
BASOS: 0 %
EOS (ABSOLUTE): 0 10*3/uL (ref 0.0–0.4)
Eos: 0 %
HEMOGLOBIN: 13.4 g/dL (ref 11.1–15.9)
Hematocrit: 41.4 % (ref 34.0–46.6)
IMMATURE GRANS (ABS): 0 10*3/uL (ref 0.0–0.1)
Immature Granulocytes: 0 %
LYMPHS ABS: 0.9 10*3/uL (ref 0.7–3.1)
LYMPHS: 14 %
MCH: 29.8 pg (ref 26.6–33.0)
MCHC: 32.4 g/dL (ref 31.5–35.7)
MCV: 92 fL (ref 79–97)
Monocytes Absolute: 0.3 10*3/uL (ref 0.1–0.9)
Monocytes: 5 %
NEUTROS ABS: 5.3 10*3/uL (ref 1.4–7.0)
Neutrophils: 81 %
PLATELETS: 196 10*3/uL (ref 150–379)
RBC: 4.5 x10E6/uL (ref 3.77–5.28)
RDW: 13.8 % (ref 12.3–15.4)
WBC: 6.5 10*3/uL (ref 3.4–10.8)

## 2017-08-26 LAB — COMPREHENSIVE METABOLIC PANEL
A/G RATIO: 1.8 (ref 1.2–2.2)
ALBUMIN: 4.4 g/dL (ref 3.5–5.5)
ALK PHOS: 110 IU/L (ref 39–117)
ALT: 16 IU/L (ref 0–32)
AST: 28 IU/L (ref 0–40)
BILIRUBIN TOTAL: 1.6 mg/dL — AB (ref 0.0–1.2)
BUN / CREAT RATIO: 20 (ref 9–23)
BUN: 17 mg/dL (ref 6–24)
CO2: 25 mmol/L (ref 20–29)
Calcium: 9.3 mg/dL (ref 8.7–10.2)
Chloride: 101 mmol/L (ref 96–106)
Creatinine, Ser: 0.83 mg/dL (ref 0.57–1.00)
GFR calc non Af Amer: 82 mL/min/{1.73_m2} (ref >59)
GFR, EST AFRICAN AMERICAN: 95 mL/min/{1.73_m2} (ref >59)
Globulin, Total: 2.4 g/dL (ref 1.5–4.5)
Glucose: 84 mg/dL (ref 65–99)
Potassium: 4.2 mmol/L (ref 3.5–5.2)
Sodium: 141 mmol/L (ref 134–144)
TOTAL PROTEIN: 6.8 g/dL (ref 6.0–8.5)

## 2017-08-26 LAB — FOLLICLE STIMULATING HORMONE: FSH: 124.8 m[IU]/mL

## 2017-08-26 LAB — LIPID PANEL
Chol/HDL Ratio: 1.8 ratio (ref 0.0–4.4)
Cholesterol, Total: 168 mg/dL (ref 100–199)
HDL: 94 mg/dL (ref >39)
LDL CALC: 61 mg/dL (ref 0–99)
Triglycerides: 64 mg/dL (ref 0–149)
VLDL CHOLESTEROL CAL: 13 mg/dL (ref 5–40)

## 2017-08-31 ENCOUNTER — Ambulatory Visit
Admission: RE | Admit: 2017-08-31 | Discharge: 2017-08-31 | Disposition: A | Discharge status: Home / Self Care | Payer: BLUE CROSS/BLUE SHIELD | Source: Ambulatory Visit | Attending: Family Medicine | Admitting: Family Medicine

## 2017-08-31 DIAGNOSIS — Z1231 Encounter for screening mammogram for malignant neoplasm of breast: Secondary | ICD-10-CM

## 2017-09-21 ENCOUNTER — Ambulatory Visit (INDEPENDENT_AMBULATORY_CARE_PROVIDER_SITE_OTHER): Payer: BLUE CROSS/BLUE SHIELD | Admitting: Gynecology

## 2017-09-21 ENCOUNTER — Encounter: Payer: Self-pay | Admitting: Gynecology

## 2017-09-21 VITALS — BP 118/76 | Ht 65.0 in | Wt 116.0 lb

## 2017-09-21 DIAGNOSIS — N941 Unspecified dyspareunia: Secondary | ICD-10-CM

## 2017-09-21 DIAGNOSIS — Z01411 Encounter for gynecological examination (general) (routine) with abnormal findings: Secondary | ICD-10-CM | POA: Diagnosis not present

## 2017-09-21 DIAGNOSIS — N952 Postmenopausal atrophic vaginitis: Secondary | ICD-10-CM | POA: Diagnosis not present

## 2017-09-21 DIAGNOSIS — N951 Menopausal and female climacteric states: Secondary | ICD-10-CM | POA: Diagnosis not present

## 2017-09-21 NOTE — Patient Instructions (Signed)
Try the over-the-counter vaginal lubricants as we discussed.  Follow-up if this does not seem to help or if your menopausal symptoms worsen and we want to discuss hormone replacement therapy.  Follow-up in 1 year for annual exam.

## 2017-09-21 NOTE — Progress Notes (Signed)
PRERNA HAROLD [birth year] [id number]        52 y.o.  G2P1011 for annual gynecologic exam.  Patient is also complaining of:  1. Vaginal dryness with pain with intercourse.  Describes irritative discomfort with intercourse.  Not having daily vaginal dryness.  The vaginal discharge irritation or odor.  No urinary symptoms such as frequency dysuria urgency. 2. Hot flushes/night sweats/irregular menses.  Patient is having classic perimenopausal symptoms to include spacing out of her menses with light bleeding every several months.  Having some feelings like her periods going to start but never does for the amenorrheic months.  Having some hot flushes and night sweats but not overly bothersome to the patient at this point.  No weight changes hair or skin changes.  Past medical history,surgical history, problem list, medications, allergies, family history and social history were all reviewed and documented as reviewed in the EPIC chart.  ROS:  Performed with pertinent positives and negatives included in the history, assessment and plan.   Additional significant findings : None   Exam: [name] assistant Vitals:   09/21/17 1549  BP: 118/76  Weight: 116 lb (52.6 kg)  Height: 5\' 5"  (1.651 m)   Body mass index is 19.3 kg/m.  General appearance:  Normal affect, orientation and appearance. Skin: Grossly normal HEENT: Without gross lesions.  No cervical or supraclavicular adenopathy. Thyroid normal.  Lungs:  Clear without wheezing, rales or rhonchi Cardiac: RR, without RMG Abdominal:  Soft, nontender, without masses, guarding, rebound, organomegaly or hernia Breasts:  Examined lying and sitting without masses, retractions, discharge or axillary adenopathy. Pelvic:  Ext, BUS, Vagina: Normal with mild atrophic changes  Cervix: Normal  Uterus: Anteverted to axial, normal size, shape and contour, midline and mobile nontender   Adnexa: Without masses or tenderness    Anus and perineum: Normal    Rectovaginal: Normal sphincter tone without palpated masses or tenderness.    Assessment/Plan:  52 y.o. G66P1011 female for annual gynecologic exam with irregular menses, condom contraception.   1. Menopausal symptoms/dyspareunia.  Patient had recent [facility] checked with her routine lab work by her primary physician which returned to 124.  She has spaced out her menses and now has light bleeding every several months lasting several days.  No prolonged or atypical bleeding in between.  Having some hot flushes and sweats but having talked to her friends does not seem anywhere near as bad as her friends.  Now having some vaginal irritation with intercourse.  I reviewed the whole perimenopausal timeframe with the patient and what to expect.  Options for management reviewed to include OTC products such as soy-based for global symptoms and lubricants for vaginal symptoms up to and including HRT.  I discussed the issues of HRT to include risks versus benefits.  Symptom relief cardiovascular bone health were discussed.  Risks to include thrombosis such as stroke heart attack DVT in the breast cancer issue was also reviewed.  At this point the patient wants to try OTC products and will follow-up if her symptoms worsen or persist that she wants to rediscuss HRT. 2. Pap smear/HPV 2015.  No Pap smear done today.  No history of abnormal Pap smears previously.  Plan repeat Pap smear next year at 5-year interval per current screening guidelines. 3. Mammography 08/2017.  Continue with annual mammography next year.  Breast exam normal today.  SBE monthly reviewed. 4. Colonoscopy.  Patient is doing Cologuard through her primary physician's office.  She will continue to follow-up  with her in reference to colon screening. 5. Health maintenance.  No routine lab work done as this was recently done at her primary physician's office.  Follow-up in 1 year, sooner as needed.  Additional time in excess of her routine gynecologic exam  was spent in direct face to face counseling and coordination of care in regards to her menopausal symptoms and dyspareunia.    Dara Lordsimothy P Alysabeth Scalia MD, 4:31 PM 09/21/2017

## 2018-08-12 ENCOUNTER — Other Ambulatory Visit: Payer: Self-pay | Admitting: Gynecology

## 2018-08-12 DIAGNOSIS — Z1231 Encounter for screening mammogram for malignant neoplasm of breast: Secondary | ICD-10-CM

## 2018-08-13 ENCOUNTER — Telehealth: Payer: Self-pay | Admitting: Family Medicine

## 2018-08-13 NOTE — Telephone Encounter (Signed)
Pt called back and I spoke with her regarding her appt. I was able to schedule her with Dr. Alvy BimlerSagardia on Friday 08/16/18 for a CPE. Pt states that she had to have a CPE before the end of the year.  I advised pt that she had her last CPE on 08/25/17 and that in most cases, insurance companies would not pay for more than one CPE per year. Pt stated that she already called [employer] and they stated that she could have a CPE at anytime during the year from January to December. Pt wanted to go ahead and schedule so I made the appt.  I advised of time, building number and late policy.

## 2018-08-13 NOTE — Telephone Encounter (Signed)
Called and LVM for pt in regards to her phone call (CRM [phone]). Advised pt that we do not have the capability to call pt and remind them of when it is time to schedule a CPE. I advised of [facility] first available for a CPE and gave the phone number. If pt calls in, please schedule with Dr. Clelia CroftShaw for a CPE at her convenience. Thank you!

## 2018-08-16 ENCOUNTER — Other Ambulatory Visit: Payer: Self-pay

## 2018-08-16 ENCOUNTER — Ambulatory Visit (INDEPENDENT_AMBULATORY_CARE_PROVIDER_SITE_OTHER): Payer: BLUE CROSS/BLUE SHIELD | Admitting: Emergency Medicine

## 2018-08-16 ENCOUNTER — Encounter: Payer: Self-pay | Admitting: Emergency Medicine

## 2018-08-16 VITALS — BP 117/76 | HR 60 | Temp 97.7°F | Resp 16 | Ht 64.0 in | Wt 116.4 lb

## 2018-08-16 DIAGNOSIS — Z13228 Encounter for screening for other metabolic disorders: Secondary | ICD-10-CM

## 2018-08-16 DIAGNOSIS — Z13220 Encounter for screening for lipoid disorders: Secondary | ICD-10-CM | POA: Diagnosis not present

## 2018-08-16 DIAGNOSIS — Z1329 Encounter for screening for other suspected endocrine disorder: Secondary | ICD-10-CM

## 2018-08-16 DIAGNOSIS — Z130 Encounter for screening for diseases of the blood and blood-forming organs and certain disorders involving the immune mechanism: Secondary | ICD-10-CM

## 2018-08-16 DIAGNOSIS — Z0000 Encounter for general adult medical examination without abnormal findings: Secondary | ICD-10-CM

## 2018-08-16 NOTE — Patient Instructions (Addendum)
   If you have lab work done today you will be contacted with your lab results within the next 2 weeks.  If you have not heard from us then please contact us. The fastest way to get your results is to register for My Chart.   IF you received an x-ray today, you will receive an invoice from [facility]. Please contact [facility] at [phone] with questions or concerns regarding your invoice.   IF you received labwork today, you will receive an invoice from [facility]. Please contact [facility] at [phone] with questions or concerns regarding your invoice.   Our billing staff will not be able to assist you with questions regarding bills from these companies.  You will be contacted with the lab results as soon as they are available. The fastest way to get your results is to activate your My Chart account. Instructions are located on the last page of this paperwork. If you have not heard from us regarding the results in 2 weeks, please contact this office.     Health Maintenance, Female Adopting a healthy lifestyle and getting preventive care can go a long way to promote health and wellness. Talk with your health care provider about what schedule of regular examinations is right for you. This is a good chance for you to check in with your provider about disease prevention and staying healthy. In between checkups, there are plenty of things you can do on your own. Experts have done a lot of research about which lifestyle changes and preventive measures are most likely to keep you healthy. Ask your health care provider for more information. Weight and diet Eat a healthy diet  Be sure to include plenty of vegetables, fruits, low-fat dairy products, and lean protein.  Do not eat a lot of foods high in solid fats, added sugars, or salt.  Get regular exercise. This is one of the most important things you can do for your health. ? Most adults should exercise for at least 150  minutes each week. The exercise should increase your heart rate and make you sweat (moderate-intensity exercise). ? Most adults should also do strengthening exercises at least twice a week. This is in addition to the moderate-intensity exercise.  Maintain a healthy weight  Body mass index (BMI) is a measurement that can be used to identify possible weight problems. It estimates body fat based on height and weight. Your health care provider can help determine your BMI and help you achieve or maintain a healthy weight.  For females 20 years of age and older: ? A BMI below 18.5 is considered underweight. ? A BMI of 18.5 to 24.9 is normal. ? A BMI of 25 to 29.9 is considered overweight. ? A BMI of 30 and above is considered obese.  Watch levels of cholesterol and blood lipids  You should start having your blood tested for lipids and cholesterol at 51 years of age, then have this test every 5 years.  You may need to have your cholesterol levels checked more often if: ? Your lipid or cholesterol levels are high. ? You are older than 50 years of age. ? You are at high risk for heart disease.  Cancer screening Lung Cancer  Lung cancer screening is recommended for adults 55-80 years old who are at high risk for lung cancer because of a history of smoking.  A yearly low-dose CT scan of the lungs is recommended for people who: ? Currently smoke. ? Have quit   quit within the past 15 years. ? Have at least a 30-pack-year history of smoking. A pack year is smoking an average of one pack of cigarettes a day for 1 year.  Yearly screening should continue until it has been 15 years since you quit.  Yearly screening should stop if you develop a health problem that would prevent you from having lung cancer treatment.  Breast Cancer  Practice breast self-awareness. This means understanding how your breasts normally appear and feel.  It also means doing regular breast self-exams. Let your health care  provider know about any changes, no matter how small.  If you are in your 20s or 30s, you should have a clinical breast exam (CBE) by a health care provider every 1-3 years as part of a regular health exam.  If you are 60 or older, have a CBE every year. Also consider having a breast X-ray (mammogram) every year.  If you have a family history of breast cancer, talk to your health care provider about genetic screening.  If you are at high risk for breast cancer, talk to your health care provider about having an MRI and a mammogram every year.  Breast cancer gene (BRCA) assessment is recommended for women who have family members with BRCA-related cancers. BRCA-related cancers include: ? Breast. ? Ovarian. ? Tubal. ? Peritoneal cancers.  Results of the assessment will determine the need for genetic counseling and BRCA1 and BRCA2 testing.  Cervical Cancer Your health care provider may recommend that you be screened regularly for cancer of the pelvic organs (ovaries, uterus, and vagina). This screening involves a pelvic examination, including checking for microscopic changes to the surface of your cervix (Pap test). You may be encouraged to have this screening done every 3 years, beginning at age 34.  For women ages 50-65, health care providers may recommend pelvic exams and Pap testing every 3 years, or they may recommend the Pap and pelvic exam, combined with testing for human papilloma virus (HPV), every 5 years. Some types of HPV increase your risk of cervical cancer. Testing for HPV may also be done on women of any age with unclear Pap test results.  Other health care providers may not recommend any screening for nonpregnant women who are considered low risk for pelvic cancer and who do not have symptoms. Ask your health care provider if a screening pelvic exam is right for you.  If you have had past treatment for cervical cancer or a condition that could lead to cancer, you need Pap tests  and screening for cancer for at least 20 years after your treatment. If Pap tests have been discontinued, your risk factors (such as having a new sexual partner) need to be reassessed to determine if screening should resume. Some women have medical problems that increase the chance of getting cervical cancer. In these cases, your health care provider may recommend more frequent screening and Pap tests.  Colorectal Cancer  This type of cancer can be detected and often prevented.  Routine colorectal cancer screening usually begins at 51 years of age and continues through 51 years of age.  Your health care provider may recommend screening at an earlier age if you have risk factors for colon cancer.  Your health care provider may also recommend using home test kits to check for hidden blood in the stool.  A small camera at the end of a tube can be used to examine your colon directly (sigmoidoscopy or colonoscopy). This is done to  for the earliest forms of colorectal cancer.  Routine screening usually begins at age 50.  Direct examination of the colon should be repeated every 5-10 years through 51 years of age. However, you may need to be screened more often if early forms of precancerous polyps or small growths are found.  Skin Cancer  Check your skin from head to toe regularly.  Tell your health care provider about any new moles or changes in moles, especially if there is a change in a mole's shape or color.  Also tell your health care provider if you have a mole that is larger than the size of a pencil eraser.  Always use sunscreen. Apply sunscreen liberally and repeatedly throughout the day.  Protect yourself by wearing long sleeves, pants, a wide-brimmed hat, and sunglasses whenever you are outside.  Heart disease, diabetes, and high blood pressure  High blood pressure causes heart disease and increases the risk of stroke. High blood pressure is more likely to develop  in: ? People who have blood pressure in the high end of the normal range (130-139/85-89 mm Hg). ? People who are overweight or obese. ? People who are African American.  If you are 18-39 years of age, have your blood pressure checked every 3-5 years. If you are 40 years of age or older, have your blood pressure checked every year. You should have your blood pressure measured twice-once when you are at a hospital or clinic, and once when you are not at a hospital or clinic. Record the average of the two measurements. To check your blood pressure when you are not at a hospital or clinic, you can use: ? An automated blood pressure machine at a pharmacy. ? A home blood pressure monitor.  If you are between 55 years and 79 years old, ask your health care provider if you should take aspirin to prevent strokes.  Have regular diabetes screenings. This involves taking a blood sample to check your fasting blood sugar level. ? If you are at a normal weight and have a low risk for diabetes, have this test once every three years after 51 years of age. ? If you are overweight and have a high risk for diabetes, consider being tested at a younger age or more often. Preventing infection Hepatitis B  If you have a higher risk for hepatitis B, you should be screened for this virus. You are considered at high risk for hepatitis B if: ? You were born in a country where hepatitis B is common. Ask your health care provider which countries are considered high risk. ? Your parents were born in a high-risk country, and you have not been immunized against hepatitis B (hepatitis B vaccine). ? You have HIV or AIDS. ? You use needles to inject street drugs. ? You live with someone who has hepatitis B. ? You have had sex with someone who has hepatitis B. ? You get hemodialysis treatment. ? You take certain medicines for conditions, including cancer, organ transplantation, and autoimmune conditions.  Hepatitis C  Blood  testing is recommended for: ? Everyone born from 1945 through 1965. ? Anyone with known risk factors for hepatitis C.  Sexually transmitted infections (STIs)  You should be screened for sexually transmitted infections (STIs) including gonorrhea and chlamydia if: ? You are sexually active and are younger than 51 years of age. ? You are older than 51 years of age and your health care provider tells you that you are at risk for   for this type of infection. ? Your sexual activity has changed since you were last screened and you are at an increased risk for chlamydia or gonorrhea. Ask your health care provider if you are at risk.  If you do not have HIV, but are at risk, it may be recommended that you take a prescription medicine daily to prevent HIV infection. This is called pre-exposure prophylaxis (PrEP). You are considered at risk if: ? You are sexually active and do not regularly use condoms or know the HIV status of your partner(s). ? You take drugs by injection. ? You are sexually active with a partner who has HIV.  Talk with your health care provider about whether you are at high risk of being infected with HIV. If you choose to begin PrEP, you should first be tested for HIV. You should then be tested every 3 months for as long as you are taking PrEP. Pregnancy  If you are premenopausal and you may become pregnant, ask your health care provider about preconception counseling.  If you may become pregnant, take 400 to 800 micrograms (mcg) of folic acid every day.  If you want to prevent pregnancy, talk to your health care provider about birth control (contraception). Osteoporosis and menopause  Osteoporosis is a disease in which the bones lose minerals and strength with aging. This can result in serious bone fractures. Your risk for osteoporosis can be identified using a bone density scan.  If you are 36 years of age or older, or if you are at risk for osteoporosis and fractures, ask your  health care provider if you should be screened.  Ask your health care provider whether you should take a calcium or vitamin D supplement to lower your risk for osteoporosis.  Menopause may have certain physical symptoms and risks.  Hormone replacement therapy may reduce some of these symptoms and risks. Talk to your health care provider about whether hormone replacement therapy is right for you. Follow these instructions at home:  Schedule regular health, dental, and eye exams.  Stay current with your immunizations.  Do not use any tobacco products including cigarettes, chewing tobacco, or electronic cigarettes.  If you are pregnant, do not drink alcohol.  If you are breastfeeding, limit how much and how often you drink alcohol.  Limit alcohol intake to no more than 1 drink per day for nonpregnant women. One drink equals 12 ounces of beer, 5 ounces of wine, or 1 ounces of hard liquor.  Do not use street drugs.  Do not share needles.  Ask your health care provider for help if you need support or information about quitting drugs.  Tell your health care provider if you often feel depressed.  Tell your health care provider if you have ever been abused or do not feel safe at home. This information is not intended to replace advice given to you by your health care provider. Make sure you discuss any questions you have with your health care provider. Document Released: 03/20/2011 Document Revised: 02/10/2016 Document Reviewed: 06/08/2015 [employer]vier Interactive Patient Education  [employer].

## 2018-08-16 NOTE — Progress Notes (Signed)
Carolyn Woodward 52 y.o.   Chief Complaint  Patient presents with  . Annual Exam    IN GOOD HEALTH NOT ISSUES    HISTORY OF PRESENT ILLNESS: This is a 52 y.o. female here for her annual exam.  Has no complaints or medical concerns. No history of chronic medical problems.  On no chronic medications. Healthy lifestyle.  Exercises regularly.  Excellent diet.  Non-smoker. Up-to-date with her gynecological evaluations.  Mammogram scheduled for next month.  GYN visit scheduled next January.  HPI   Prior to Admission medications   Medication Sig Start Date End Date Taking? Authorizing Provider  IRON PO Take by mouth.    [provider]  Multiple Vitamin (MULTIVITAMIN) capsule Take 1 capsule by mouth daily.      [provider]  Probiotic Product (PROBIOTIC PO) Take by mouth.    [provider]    No Known Allergies  There are no active problems to display for this patient.   No past medical history on file.  No past surgical history on file.  Social History   Socioeconomic History  . Marital status: Married    Spouse name: Not on file  . Number of children: Not on file  . Years of education: Not on file  . Highest education level: Not on file  Occupational History  . Not on file  Social Needs  . Financial resource strain: Not on file  . Food insecurity:    Worry: Not on file    Inability: Not on file  . Transportation needs:    Medical: Not on file    Non-medical: Not on file  Tobacco Use  . Smoking status: Never Smoker  . Smokeless tobacco: Never Used  Substance and Sexual Activity  . Alcohol use: Yes    Alcohol/week: 0.0 standard drinks    Comment: rare   . Drug use: No  . Sexual activity: Yes    Comment: 1st intercourse 52 yo-Fewer than 5 partners  Lifestyle  . Physical activity:    Days per week: Not on file    Minutes per session: Not on file  . Stress: Not on file  Relationships  . Social connections:    Talks on phone: Not  on file    Gets together: Not on file    Attends religious service: Not on file    Active member of club or organization: Not on file    Attends meetings of clubs or organizations: Not on file    Relationship status: Not on file  . Intimate partner violence:    Fear of current or ex partner: Not on file    Emotionally abused: Not on file    Physically abused: Not on file    Forced sexual activity: Not on file  Other Topics Concern  . Not on file  Social History Narrative  . Not on file    Family History  Problem Relation Age of Onset  . Heart attack Maternal Grandfather   . Breast cancer Neg Hx      Review of Systems  Constitutional: Negative.  Negative for chills, fever, malaise/fatigue and weight loss.  HENT: Negative.  Negative for congestion, hearing loss, nosebleeds and sore throat.   Eyes: Negative.  Negative for blurred vision, double vision, pain, discharge and redness.  Respiratory: Negative.  Negative for cough and shortness of breath.   Cardiovascular: Negative.  Negative for chest pain and palpitations.  Gastrointestinal: Negative.  Negative for abdominal pain, blood  in stool, diarrhea, melena, nausea and vomiting.  Genitourinary: Negative.  Negative for dysuria and hematuria.  Musculoskeletal: Negative.   Skin: Negative.  Negative for rash.  Neurological: Negative.  Negative for dizziness, sensory change, focal weakness and headaches.  Endo/Heme/Allergies: Negative.   All other systems reviewed and are negative.  Vitals:   08/16/18 0910  BP: 117/76  Pulse: 60  Resp: 16  Temp: 97.7 F (36.5 C)  SpO2: 98%     Physical Exam  Constitutional: She is oriented to person, place, and time. She appears well-developed and well-nourished.  HENT:  Head: Normocephalic and atraumatic.  Nose: Nose normal.  Mouth/Throat: Oropharynx is clear and moist.  Eyes: Pupils are equal, round, and reactive to light. Conjunctivae and EOM are normal.  Neck: Normal range of  motion. Neck supple. No JVD present. No thyromegaly present.  Cardiovascular: Normal rate, regular rhythm, normal heart sounds and intact distal pulses.  No murmur heard. Pulmonary/Chest: Effort normal and breath sounds normal.  Abdominal: Soft. Bowel sounds are normal. She exhibits no distension. There is no tenderness.  Musculoskeletal: Normal range of motion.  Lymphadenopathy:    She has no cervical adenopathy.  Neurological: She is alert and oriented to person, place, and time. No sensory deficit. She exhibits normal muscle tone.  Skin: Skin is warm and dry. Capillary refill takes less than 2 seconds.  Psychiatric: She has a normal mood and affect. Her behavior is normal.  Vitals reviewed.    ASSESSMENT & PLAN: Carolyn Woodward was seen today for annual exam.  Diagnoses and all orders for this visit:  Routine general medical examination at a health care facility  Screening for deficiency anemia -     CBC with Differential  Screening for lipoid disorders -     Lipid panel  Screening for endocrine, metabolic and immunity disorder -     Comprehensive metabolic panel -     Hemoglobin A1c -     Lipid panel   Patient Instructions       If you have lab work done today you will be contacted with your lab results within the next 2 weeks.  If you have not heard from [place] then please contact us. The fastest way to get your results is to register for My Chart.   IF you received an x-ray today, you will receive an invoice from [facility]. Please contact [facility] at [phone] with questions or concerns regarding your invoice.   IF you received labwork today, you will receive an invoice from [facility]. Please contact [facility] at [phone] with questions or concerns regarding your invoice.   Our billing staff will not be able to assist you with questions regarding bills from these companies.  You will be contacted with the lab results as soon as they are available.  The fastest way to get your results is to activate your My Chart account. Instructions are located on the last page of this paperwork. If you have not heard from [place] regarding the results in 2 weeks, please contact this office.      Health Maintenance, Female Adopting a healthy lifestyle and getting preventive care can go a long way to promote health and wellness. Talk with your health care provider about what schedule of regular examinations is right for you. This is a good chance for you to check in with your provider about disease prevention and staying healthy. In between checkups, there are plenty of things you can do on your own. Experts have done  a lot of research about which lifestyle changes and preventive measures are most likely to keep you healthy. Ask your health care provider for more information. Weight and diet Eat a healthy diet  Be sure to include plenty of vegetables, fruits, low-fat dairy products, and lean protein.  Do not eat a lot of foods high in solid fats, added sugars, or salt.  Get regular exercise. This is one of the most important things you can do for your health. ? Most adults should exercise for at least 150 minutes each week. The exercise should increase your heart rate and make you sweat (moderate-intensity exercise). ? Most adults should also do strengthening exercises at least twice a week. This is in addition to the moderate-intensity exercise.  Maintain a healthy weight  Body mass index (BMI) is a measurement that can be used to identify possible weight problems. It estimates body fat based on height and weight. Your health care provider can help determine your BMI and help you achieve or maintain a healthy weight.  For females 42 years of age and older: ? A BMI below 18.5 is considered underweight. ? A BMI of 18.5 to 24.9 is normal. ? A BMI of 25 to 29.9 is considered overweight. ? A BMI of 30 and above is considered obese.  Watch levels of  cholesterol and blood lipids  You should start having your blood tested for lipids and cholesterol at 51 years of age, then have this test every 5 years.  You may need to have your cholesterol levels checked more often if: ? Your lipid or cholesterol levels are high. ? You are older than 51 years of age. ? You are at high risk for heart disease.  Cancer screening Lung Cancer  Lung cancer screening is recommended for adults 38-38 years old who are at high risk for lung cancer because of a history of smoking.  A yearly low-dose CT scan of the lungs is recommended for people who: ? Currently smoke. ? Have quit within the past 15 years. ? Have at least a 30-pack-year history of smoking. A pack year is smoking an average of one pack of cigarettes a day for 1 year.  Yearly screening should continue until it has been 15 years since you quit.  Yearly screening should stop if you develop a health problem that would prevent you from having lung cancer treatment.  Breast Cancer  Practice breast self-awareness. This means understanding how your breasts normally appear and feel.  It also means doing regular breast self-exams. Let your health care provider know about any changes, no matter how small.  If you are in your 20s or 30s, you should have a clinical breast exam (CBE) by a health care provider every 1-3 years as part of a regular health exam.  If you are 72 or older, have a CBE every year. Also consider having a breast X-ray (mammogram) every year.  If you have a family history of breast cancer, talk to your health care provider about genetic screening.  If you are at high risk for breast cancer, talk to your health care provider about having an MRI and a mammogram every year.  Breast cancer gene (BRCA) assessment is recommended for women who have family members with BRCA-related cancers. BRCA-related cancers include: ? Breast. ? Ovarian. ? Tubal. ? Peritoneal cancers.  Results  of the assessment will determine the need for genetic counseling and BRCA1 and BRCA2 testing.  Cervical Cancer Your health care provider may  recommend that you be screened regularly for cancer of the pelvic organs (ovaries, uterus, and vagina). This screening involves a pelvic examination, including checking for microscopic changes to the surface of your cervix (Pap test). You may be encouraged to have this screening done every 3 years, beginning at age 30.  For women ages 57-65, health care providers may recommend pelvic exams and Pap testing every 3 years, or they may recommend the Pap and pelvic exam, combined with testing for human papilloma virus (HPV), every 5 years. Some types of HPV increase your risk of cervical cancer. Testing for HPV may also be done on women of any age with unclear Pap test results.  Other health care providers may not recommend any screening for nonpregnant women who are considered low risk for pelvic cancer and who do not have symptoms. Ask your health care provider if a screening pelvic exam is right for you.  If you have had past treatment for cervical cancer or a condition that could lead to cancer, you need Pap tests and screening for cancer for at least 20 years after your treatment. If Pap tests have been discontinued, your risk factors (such as having a new sexual partner) need to be reassessed to determine if screening should resume. Some women have medical problems that increase the chance of getting cervical cancer. In these cases, your health care provider may recommend more frequent screening and Pap tests.  Colorectal Cancer  This type of cancer can be detected and often prevented.  Routine colorectal cancer screening usually begins at 51 years of age and continues through 51 years of age.  Your health care provider may recommend screening at an earlier age if you have risk factors for colon cancer.  Your health care provider may also recommend using  home test kits to check for hidden blood in the stool.  A small camera at the end of a tube can be used to examine your colon directly (sigmoidoscopy or colonoscopy). This is done to check for the earliest forms of colorectal cancer.  Routine screening usually begins at age 41.  Direct examination of the colon should be repeated every 5-10 years through 51 years of age. However, you may need to be screened more often if early forms of precancerous polyps or small growths are found.  Skin Cancer  Check your skin from head to toe regularly.  Tell your health care provider about any new moles or changes in moles, especially if there is a change in a mole's shape or color.  Also tell your health care provider if you have a mole that is larger than the size of a pencil eraser.  Always use sunscreen. Apply sunscreen liberally and repeatedly throughout the day.  Protect yourself by wearing long sleeves, pants, a wide-brimmed hat, and sunglasses whenever you are outside.  Heart disease, diabetes, and high blood pressure  High blood pressure causes heart disease and increases the risk of stroke. High blood pressure is more likely to develop in: ? People who have blood pressure in the high end of the normal range (130-139/85-89 mm Hg). ? People who are overweight or obese. ? People who are African American.  If you are 23-34 years of age, have your blood pressure checked every 3-5 years. If you are 25 years of age or older, have your blood pressure checked every year. You should have your blood pressure measured twice-once when you are at a hospital or clinic, and once when you are not  at a hospital or clinic. Record the average of the two measurements. To check your blood pressure when you are not at a hospital or clinic, you can use: ? An automated blood pressure machine at a pharmacy. ? A home blood pressure monitor.  If you are between 16 years and 56 years old, ask your health care provider  if you should take aspirin to prevent strokes.  Have regular diabetes screenings. This involves taking a blood sample to check your fasting blood sugar level. ? If you are at a normal weight and have a low risk for diabetes, have this test once every three years after 51 years of age. ? If you are overweight and have a high risk for diabetes, consider being tested at a younger age or more often. Preventing infection Hepatitis B  If you have a higher risk for hepatitis B, you should be screened for this virus. You are considered at high risk for hepatitis B if: ? You were born in a country where hepatitis B is common. Ask your health care provider which countries are considered high risk. ? Your parents were born in a high-risk country, and you have not been immunized against hepatitis B (hepatitis B vaccine). ? You have HIV or AIDS. ? You use needles to inject street drugs. ? You live with someone who has hepatitis B. ? You have had sex with someone who has hepatitis B. ? You get hemodialysis treatment. ? You take certain medicines for conditions, including cancer, organ transplantation, and autoimmune conditions.  Hepatitis C  Blood testing is recommended for: ? Everyone born from [birth year] through 1964. ? Anyone with known risk factors for hepatitis C.  Sexually transmitted infections (STIs)  You should be screened for sexually transmitted infections (STIs) including gonorrhea and chlamydia if: ? You are sexually active and are younger than 51 years of age. ? You are older than 51 years of age and your health care provider tells you that you are at risk for this type of infection. ? Your sexual activity has changed since you were last screened and you are at an increased risk for chlamydia or gonorrhea. Ask your health care provider if you are at risk.  If you do not have HIV, but are at risk, it may be recommended that you take a prescription medicine daily to prevent HIV infection. This  is called pre-exposure prophylaxis (PrEP). You are considered at risk if: ? You are sexually active and do not regularly use condoms or know the HIV status of your partner(s). ? You take drugs by injection. ? You are sexually active with a partner who has HIV.  Talk with your health care provider about whether you are at high risk of being infected with HIV. If you choose to begin PrEP, you should first be tested for HIV. You should then be tested every 3 months for as long as you are taking PrEP. Pregnancy  If you are premenopausal and you may become pregnant, ask your health care provider about preconception counseling.  If you may become pregnant, take 400 to 800 micrograms (mcg) of folic acid every day.  If you want to prevent pregnancy, talk to your health care provider about birth control (contraception). Osteoporosis and menopause  Osteoporosis is a disease in which the bones lose minerals and strength with aging. This can result in serious bone fractures. Your risk for osteoporosis can be identified using a bone density scan.  If you are 65 years of  age or older, or if you are at risk for osteoporosis and fractures, ask your health care provider if you should be screened.  Ask your health care provider whether you should take a calcium or vitamin D supplement to lower your risk for osteoporosis.  Menopause may have certain physical symptoms and risks.  Hormone replacement therapy may reduce some of these symptoms and risks. Talk to your health care provider about whether hormone replacement therapy is right for you. Follow these instructions at home:  Schedule regular health, dental, and eye exams.  Stay current with your immunizations.  Do not use any tobacco products including cigarettes, chewing tobacco, or electronic cigarettes.  If you are pregnant, do not drink alcohol.  If you are breastfeeding, limit how much and how often you drink alcohol.  Limit alcohol intake  to no more than 1 drink per day for nonpregnant women. One drink equals 12 ounces of beer, 5 ounces of wine, or 1 ounces of hard liquor.  Do not use street drugs.  Do not share needles.  Ask your health care provider for help if you need support or information about quitting drugs.  Tell your health care provider if you often feel depressed.  Tell your health care provider if you have ever been abused or do not feel safe at home. This information is not intended to replace advice given to you by your health care provider. Make sure you discuss any questions you have with your health care provider. Document Released: 03/20/2011 Document Revised: 02/10/2016 Document Reviewed: 06/08/2015 Elsevier Interactive Patient Education  2018 Elsevier Inc.      Agustina Caroli, MD [facility]

## 2018-08-17 LAB — COMPREHENSIVE METABOLIC PANEL
ALBUMIN: 4.2 g/dL (ref 3.5–5.5)
ALK PHOS: 114 IU/L (ref 39–117)
ALT: 26 IU/L (ref 0–32)
AST: 34 IU/L (ref 0–40)
Albumin/Globulin Ratio: 2.1 (ref 1.2–2.2)
BUN / CREAT RATIO: 21 (ref 9–23)
BUN: 15 mg/dL (ref 6–24)
Bilirubin Total: 1.2 mg/dL (ref 0.0–1.2)
CALCIUM: 8.7 mg/dL (ref 8.7–10.2)
CO2: 24 mmol/L (ref 20–29)
CREATININE: 0.73 mg/dL (ref 0.57–1.00)
Chloride: 100 mmol/L (ref 96–106)
GFR calc Af Amer: 110 mL/min/{1.73_m2} (ref >59)
GFR, EST NON AFRICAN AMERICAN: 96 mL/min/{1.73_m2} (ref >59)
GLOBULIN, TOTAL: 2 g/dL (ref 1.5–4.5)
Glucose: 89 mg/dL (ref 65–99)
Potassium: 3.9 mmol/L (ref 3.5–5.2)
SODIUM: 138 mmol/L (ref 134–144)
Total Protein: 6.2 g/dL (ref 6.0–8.5)

## 2018-08-17 LAB — CBC WITH DIFFERENTIAL/PLATELET
Basophils Absolute: 0 10*3/uL (ref 0.0–0.2)
Basos: 1 %
EOS (ABSOLUTE): 0 10*3/uL (ref 0.0–0.4)
EOS: 1 %
HEMATOCRIT: 36.2 % (ref 34.0–46.6)
HEMOGLOBIN: 12.1 g/dL (ref 11.1–15.9)
Immature Grans (Abs): 0 10*3/uL (ref 0.0–0.1)
Immature Granulocytes: 0 %
LYMPHS: 37 %
Lymphocytes Absolute: 1.2 10*3/uL (ref 0.7–3.1)
MCH: 30.6 pg (ref 26.6–33.0)
MCHC: 33.4 g/dL (ref 31.5–35.7)
MCV: 92 fL (ref 79–97)
MONOCYTES: 9 %
MONOS ABS: 0.3 10*3/uL (ref 0.1–0.9)
NEUTROS PCT: 52 %
Neutrophils Absolute: 1.7 10*3/uL (ref 1.4–7.0)
Platelets: 181 10*3/uL (ref 150–450)
RBC: 3.95 x10E6/uL (ref 3.77–5.28)
RDW: 12.3 % (ref 12.3–15.4)
WBC: 3.3 10*3/uL — AB (ref 3.4–10.8)

## 2018-08-17 LAB — LIPID PANEL
CHOL/HDL RATIO: 1.8 ratio (ref 0.0–4.4)
Cholesterol, Total: 164 mg/dL (ref 100–199)
HDL: 93 mg/dL (ref >39)
LDL CALC: 61 mg/dL (ref 0–99)
Triglycerides: 52 mg/dL (ref 0–149)
VLDL CHOLESTEROL CAL: 10 mg/dL (ref 5–40)

## 2018-08-17 LAB — HEMOGLOBIN A1C
Est. average glucose Bld gHb Est-mCnc: 103 mg/dL
Hgb A1c MFr Bld: 5.2 % (ref 4.8–5.6)

## 2018-08-19 ENCOUNTER — Encounter: Payer: Self-pay | Admitting: *Deleted

## 2018-08-19 NOTE — Progress Notes (Signed)
Letter sent my chart

## 2018-09-20 ENCOUNTER — Ambulatory Visit
Admission: RE | Admit: 2018-09-20 | Discharge: 2018-09-20 | Disposition: A | Discharge status: Home / Self Care | Payer: BLUE CROSS/BLUE SHIELD | Source: Ambulatory Visit | Attending: Gynecology | Admitting: Gynecology

## 2018-09-20 DIAGNOSIS — Z1231 Encounter for screening mammogram for malignant neoplasm of breast: Secondary | ICD-10-CM

## 2018-10-03 ENCOUNTER — Ambulatory Visit (INDEPENDENT_AMBULATORY_CARE_PROVIDER_SITE_OTHER): Payer: BLUE CROSS/BLUE SHIELD | Admitting: Gynecology

## 2018-10-03 ENCOUNTER — Encounter: Payer: Self-pay | Admitting: Gynecology

## 2018-10-03 VITALS — BP 120/74 | Ht 65.0 in | Wt 116.0 lb

## 2018-10-03 DIAGNOSIS — N9419 Other specified dyspareunia: Secondary | ICD-10-CM

## 2018-10-03 DIAGNOSIS — N952 Postmenopausal atrophic vaginitis: Secondary | ICD-10-CM

## 2018-10-03 DIAGNOSIS — Z01419 Encounter for gynecological examination (general) (routine) without abnormal findings: Secondary | ICD-10-CM | POA: Diagnosis not present

## 2018-10-03 DIAGNOSIS — Z1151 Encounter for screening for human papillomavirus (HPV): Secondary | ICD-10-CM

## 2018-10-03 NOTE — Addendum Note (Signed)
Addended by: Dayna BarkerGARDNER, Mercedes [place] on: 10/03/2018 04:38 PM   Modules accepted: Orders

## 2018-10-03 NOTE — Progress Notes (Signed)
    Carolyn Woodward Aug 16, 1966 [id number]        53 y.o.  G2P1011 for annual gynecologic exam.  Continue to have issues with dyspareunia.  Tried OTC lubricants but still uncomfortable.  Otherwise doing well without significant menopausal symptoms.  Past medical history,surgical history, problem list, medications, allergies, family history and social history were all reviewed and documented as reviewed in the EPIC chart.  ROS:  Performed with pertinent positives and negatives included in the history, assessment and plan.   Additional significant findings : None   Exam: Carolyn Woodward assistant Vitals:   10/03/18 1610  BP: 120/74  Weight: 116 lb (52.6 kg)  Height: 5\' 5"  (1.651 m)   Body mass index is 19.3 kg/m.  General appearance:  Normal affect, orientation and appearance. Skin: Grossly normal HEENT: Without gross lesions.  No cervical or supraclavicular adenopathy. Thyroid normal.  Lungs:  Clear without wheezing, rales or rhonchi Cardiac: RR, without RMG Abdominal:  Soft, nontender, without masses, guarding, rebound, organomegaly or hernia Breasts:  Examined lying and sitting without masses, retractions, discharge or axillary adenopathy. Pelvic:  Ext, BUS, Vagina: With atrophic changes  Cervix: Normal with atrophic changes.  Pap smear/HPV done  Uterus: Anteverted, normal size, shape and contour, midline and mobile nontender   Adnexa: Without masses or tenderness    Anus and perineum: Normal   Rectovaginal: Normal sphincter tone without palpated masses or tenderness.    Assessment/Plan:  53 y.o. G79P1011 female for annual gynecologic exam.   1. Postmenopausal/atrophic genital changes/dyspareunia.  Discussed options for her dyspareunia to include HRT, vaginal estrogen, vaginal OTC products.  Patient not interested in HRT.  Is interested in trying vaginal estrogen.  We discussed the need to use it twice weekly and the risks to include absorption with systemic effects such as  thrombosis, endometrial stimulation and the breast cancer issue.  Will start on formulated estradiol cream through [facility] twice weekly.  Will call if she has any issues or any bleeding. 2. Mammography 09/20/2018.  Continue with annual mammography next year.  Breast exam normal today. 3. Pap smear/HPV 2015.  Pap smear/HPV today.  No history of significant abnormal Pap smears previously. 4. Colonoscopy never.  Patient's discussing colon screening with her primary physician and will follow-up with them for this. 5. DEXA never.  Will plan further into the menopause.  Is very active and runs marathons. 6. Health maintenance.  No routine lab work done as patient does this elsewhere.  Follow-up 1 year, sooner as needed.   Dara Lords MD, 4:32 PM 10/03/2018

## 2018-10-03 NOTE — Patient Instructions (Signed)
Start on the vaginal estrogen cream twice weekly from [facility].  Call me if you have any issues with this.

## 2018-10-04 ENCOUNTER — Telehealth: Payer: Self-pay | Admitting: *Deleted

## 2018-10-04 LAB — PAP IG AND HPV HIGH-RISK: HPV DNA High Risk: NOT DETECTED

## 2018-10-04 MED ORDER — NONFORMULARY OR COMPOUNDED ITEM: 3 refills | Status: DC

## 2018-10-04 NOTE — Telephone Encounter (Signed)
-----   Message from Dara Lords, MD sent at 10/03/2018  4:35 PM EST ----- Call into [facility] formulated vaginal estradiol cream prefilled syringes twice weekly 35-month supply refill x1 year

## 2018-10-04 NOTE — Telephone Encounter (Signed)
Rx called in 

## 2019-06-11 ENCOUNTER — Encounter: Payer: Self-pay | Admitting: Gynecology

## 2019-06-18 ENCOUNTER — Encounter: Payer: Self-pay | Admitting: *Deleted

## 2019-07-28 DIAGNOSIS — H5203 Hypermetropia, bilateral: Secondary | ICD-10-CM | POA: Diagnosis not present

## 2019-07-30 ENCOUNTER — Other Ambulatory Visit: Payer: Self-pay | Admitting: Emergency Medicine

## 2019-07-30 ENCOUNTER — Other Ambulatory Visit: Payer: Self-pay | Admitting: Family Medicine

## 2019-07-30 DIAGNOSIS — Z1231 Encounter for screening mammogram for malignant neoplasm of breast: Secondary | ICD-10-CM

## 2019-08-18 ENCOUNTER — Other Ambulatory Visit: Payer: Self-pay

## 2019-08-18 ENCOUNTER — Ambulatory Visit (INDEPENDENT_AMBULATORY_CARE_PROVIDER_SITE_OTHER): Payer: BC Managed Care – PPO | Admitting: Emergency Medicine

## 2019-08-18 ENCOUNTER — Encounter: Payer: Self-pay | Admitting: Emergency Medicine

## 2019-08-18 VITALS — BP 113/69 | HR 65 | Temp 98.0°F | Resp 16 | Ht 65.0 in | Wt 118.0 lb

## 2019-08-18 DIAGNOSIS — Z13220 Encounter for screening for lipoid disorders: Secondary | ICD-10-CM | POA: Diagnosis not present

## 2019-08-18 DIAGNOSIS — Z13228 Encounter for screening for other metabolic disorders: Secondary | ICD-10-CM

## 2019-08-18 DIAGNOSIS — Z0000 Encounter for general adult medical examination without abnormal findings: Secondary | ICD-10-CM | POA: Diagnosis not present

## 2019-08-18 DIAGNOSIS — Z130 Encounter for screening for diseases of the blood and blood-forming organs and certain disorders involving the immune mechanism: Secondary | ICD-10-CM

## 2019-08-18 DIAGNOSIS — Z1329 Encounter for screening for other suspected endocrine disorder: Secondary | ICD-10-CM | POA: Diagnosis not present

## 2019-08-18 NOTE — Patient Instructions (Addendum)
   If you have lab work done today you will be contacted with your lab results within the next 2 weeks.  If you have not heard from us then please contact us. The fastest way to get your results is to register for My Chart.   IF you received an x-ray today, you will receive an invoice from [facility]. Please contact [facility] at [phone] with questions or concerns regarding your invoice.   IF you received labwork today, you will receive an invoice from [facility]. Please contact [facility] at [phone] with questions or concerns regarding your invoice.   Our billing staff will not be able to assist you with questions regarding bills from these companies.  You will be contacted with the lab results as soon as they are available. The fastest way to get your results is to activate your My Chart account. Instructions are located on the last page of this paperwork. If you have not heard from us regarding the results in 2 weeks, please contact this office.      Health Maintenance, Female Adopting a healthy lifestyle and getting preventive care are important in promoting health and wellness. Ask your health care provider about:  The right schedule for you to have regular tests and exams.  Things you can do on your own to prevent diseases and keep yourself healthy. What should I know about diet, weight, and exercise? Eat a healthy diet   Eat a diet that includes plenty of vegetables, fruits, low-fat dairy products, and lean protein.  Do not eat a lot of foods that are high in solid fats, added sugars, or sodium. Maintain a healthy weight Body mass index (BMI) is used to identify weight problems. It estimates body fat based on height and weight. Your health care provider can help determine your BMI and help you achieve or maintain a healthy weight. Get regular exercise Get regular exercise. This is one of the most important things you can do for your health. Most  adults should:  Exercise for at least 150 minutes each week. The exercise should increase your heart rate and make you sweat (moderate-intensity exercise).  Do strengthening exercises at least twice a week. This is in addition to the moderate-intensity exercise.  Spend less time sitting. Even light physical activity can be beneficial. Watch cholesterol and blood lipids Have your blood tested for lipids and cholesterol at 52 years of age, then have this test every 5 years. Have your cholesterol levels checked more often if:  Your lipid or cholesterol levels are high.  You are older than 52 years of age.  You are at high risk for heart disease. What should I know about cancer screening? Depending on your health history and family history, you may need to have cancer screening at various ages. This may include screening for:  Breast cancer.  Cervical cancer.  Colorectal cancer.  Skin cancer.  Lung cancer. What should I know about heart disease, diabetes, and high blood pressure? Blood pressure and heart disease  High blood pressure causes heart disease and increases the risk of stroke. This is more likely to develop in people who have high blood pressure readings, are of African descent, or are overweight.  Have your blood pressure checked: ? Every 3-5 years if you are 18-39 years of age. ? Every year if you are 40 years old or older. Diabetes Have regular diabetes screenings. This checks your fasting blood sugar level. Have the screening done:  Once every   three years after age 40 if you are at a normal weight and have a low risk for diabetes.  More often and at a younger age if you are overweight or have a high risk for diabetes. What should I know about preventing infection? Hepatitis B If you have a higher risk for hepatitis B, you should be screened for this virus. Talk with your health care provider to find out if you are at risk for hepatitis B infection. Hepatitis  C Testing is recommended for:  Everyone born from 1945 through 1965.  Anyone with known risk factors for hepatitis C. Sexually transmitted infections (STIs)  Get screened for STIs, including gonorrhea and chlamydia, if: ? You are sexually active and are younger than 52 years of age. ? You are older than 52 years of age and your health care provider tells you that you are at risk for this type of infection. ? Your sexual activity has changed since you were last screened, and you are at increased risk for chlamydia or gonorrhea. Ask your health care provider if you are at risk.  Ask your health care provider about whether you are at high risk for HIV. Your health care provider may recommend a prescription medicine to help prevent HIV infection. If you choose to take medicine to prevent HIV, you should first get tested for HIV. You should then be tested every 3 months for as long as you are taking the medicine. Pregnancy  If you are about to stop having your period (premenopausal) and you may become pregnant, seek counseling before you get pregnant.  Take 400 to 800 micrograms (mcg) of folic acid every day if you become pregnant.  Ask for birth control (contraception) if you want to prevent pregnancy. Osteoporosis and menopause Osteoporosis is a disease in which the bones lose minerals and strength with aging. This can result in bone fractures. If you are 65 years old or older, or if you are at risk for osteoporosis and fractures, ask your health care provider if you should:  Be screened for bone loss.  Take a calcium or vitamin D supplement to lower your risk of fractures.  Be given hormone replacement therapy (HRT) to treat symptoms of menopause. Follow these instructions at home: Lifestyle  Do not use any products that contain nicotine or tobacco, such as cigarettes, e-cigarettes, and chewing tobacco. If you need help quitting, ask your health care provider.  Do not use street  drugs.  Do not share needles.  Ask your health care provider for help if you need support or information about quitting drugs. Alcohol use  Do not drink alcohol if: ? Your health care provider tells you not to drink. ? You are pregnant, may be pregnant, or are planning to become pregnant.  If you drink alcohol: ? Limit how much you use to 0-1 drink a day. ? Limit intake if you are breastfeeding.  Be aware of how much alcohol is in your drink. In the [place] one drink equals one 12 oz bottle of beer (355 mL), one 5 oz glass of wine (148 mL), or one 1 oz glass of hard liquor (44 mL). General instructions  Schedule regular health, dental, and eye exams.  Stay current with your vaccines.  Tell your health care provider if: ? You often feel depressed. ? You have ever been abused or do not feel safe at home. Summary  Adopting a healthy lifestyle and getting preventive care are important in promoting health and wellness.    Follow your health care provider's instructions about healthy diet, exercising, and getting tested or screened for diseases.  Follow your health care provider's instructions on monitoring your cholesterol and blood pressure. This information is not intended to replace advice given to you by your health care provider. Make sure you discuss any questions you have with your health care provider. Document Released: 03/20/2011 Document Revised: 08/28/2018 Document Reviewed: 08/28/2018 Elsevier Patient Education  2020 Elsevier Inc.  

## 2019-08-18 NOTE — Progress Notes (Signed)
Carolyn Woodward 53 y.o.   Chief Complaint  Patient presents with  . Annual Exam    no pap smear    HISTORY OF PRESENT ILLNESS: This is a 53 y.o. female here for annual exam.  No chronic medical problems.  On no chronic medications. Healthy female with a healthy lifestyle.  Exercises regularly.  Runs marathons. Excellent nutrition and eating habits. Has no complaints or medical concerns today. Seen by me last year for annual exam and no medical concerns were identified then.  Had normal blood work.  HPI   Prior to Admission medications   Medication Sig Start Date End Date Taking? Authorizing Provider  IRON PO Take by mouth.   Yes [provider]  Multiple Vitamin (MULTIVITAMIN) capsule Take 1 capsule by mouth daily.     Yes [provider]  NONFORMULARY OR COMPOUNDED ITEM Estradiol vaginal cream 0.02% insert one applicator twice weekly 10/04/18  Yes Fontaine, Nadyne Coombesimothy P, MD  Probiotic Product (PROBIOTIC PO) Take by mouth.   Yes [provider]    No Known Allergies  There are no active problems to display for this patient.   No past medical history on file.  No past surgical history on file.  Social History   Socioeconomic History  . Marital status: Married    Spouse name: Not on file  . Number of children: Not on file  . Years of education: Not on file  . Highest education level: Not on file  Occupational History  . Not on file  Social Needs  . Financial resource strain: Not on file  . Food insecurity    Worry: Not on file    Inability: Not on file  . Transportation needs    Medical: Not on file    Non-medical: Not on file  Tobacco Use  . Smoking status: Never Smoker  . Smokeless tobacco: Never Used  Substance and Sexual Activity  . Alcohol use: Yes    Alcohol/week: 0.0 standard drinks    Comment: rare   . Drug use: No  . Sexual activity: Yes    Birth control/protection: Post-menopausal    Comment: 1st intercourse 53 yo-Fewer  than 5 partners  Lifestyle  . Physical activity    Days per week: Not on file    Minutes per session: Not on file  . Stress: Not on file  Relationships  . Social Musicianconnections    Talks on phone: Not on file    Gets together: Not on file    Attends religious service: Not on file    Active member of club or organization: Not on file    Attends meetings of clubs or organizations: Not on file    Relationship status: Not on file  . Intimate partner violence    Fear of current or ex partner: Not on file    Emotionally abused: Not on file    Physically abused: Not on file    Forced sexual activity: Not on file  Other Topics Concern  . Not on file  Social History Narrative  . Not on file    Family History  Problem Relation Age of Onset  . Heart attack Maternal Grandfather   . Breast cancer Neg Hx      Review of Systems  Constitutional: Negative.  Negative for chills and fever.  HENT: Negative.  Negative for congestion and sore throat.   Eyes: Negative.   Respiratory: Negative.  Negative for cough and shortness of breath.   Cardiovascular:  Negative.  Negative for chest pain and palpitations.  Gastrointestinal: Negative.  Negative for abdominal pain, blood in stool, diarrhea, nausea and vomiting.  Genitourinary: Negative.  Negative for dysuria and hematuria.  Musculoskeletal: Negative.  Negative for back pain, myalgias and neck pain.  Skin: Negative.  Negative for rash.  Neurological: Negative.  Negative for dizziness and headaches.  Endo/Heme/Allergies: Negative.   All other systems reviewed and are negative.    Physical Exam Vitals signs reviewed.  Constitutional:      Appearance: Normal appearance.  HENT:     Head: Normocephalic.  Eyes:     Extraocular Movements: Extraocular movements intact.     Conjunctiva/sclera: Conjunctivae normal.     Pupils: Pupils are equal, round, and reactive to light.  Neck:     Musculoskeletal: Normal range of motion and neck supple.   Cardiovascular:     Rate and Rhythm: Normal rate and regular rhythm.     Pulses: Normal pulses.     Heart sounds: Normal heart sounds.  Pulmonary:     Effort: Pulmonary effort is normal.     Breath sounds: Normal breath sounds.  Abdominal:     General: Bowel sounds are normal. There is no distension.     Palpations: Abdomen is soft.     Tenderness: There is no abdominal tenderness.  Musculoskeletal: Normal range of motion.  Skin:    General: Skin is warm and dry.     Capillary Refill: Capillary refill takes less than 2 seconds.  Neurological:     General: No focal deficit present.     Mental Status: She is alert and oriented to person, place, and time.  Psychiatric:        Mood and Affect: Mood normal.        Behavior: Behavior normal.      ASSESSMENT & PLAN: [name] was seen today for annual exam.  Diagnoses and all orders for this visit:  Routine general medical examination at a health care facility  Screening for endocrine, metabolic and immunity disorder -     Comprehensive metabolic panel  Screening for deficiency anemia -     CBC with Differential  Screening for lipoid disorders -     Lipid panel    Patient Instructions        If you have lab work done today you will be contacted with your lab results within the next 2 weeks.  If you have not heard from [place] then please contact us. The fastest way to get your results is to register for My Chart.   IF you received an x-ray today, you will receive an invoice from [facility]. Please contact [facility] at [phone] with questions or concerns regarding your invoice.   IF you received labwork today, you will receive an invoice from [facility]. Please contact [facility] at [phone] with questions or concerns regarding your invoice.   Our billing staff will not be able to assist you with questions regarding bills from these companies.  You will be contacted with the lab results as soon as  they are available. The fastest way to get your results is to activate your My Chart account. Instructions are located on the last page of this paperwork. If you have not heard from [place] regarding the results in 2 weeks, please contact this office.     Health Maintenance, Female Adopting a healthy lifestyle and getting preventive care are important in promoting health and wellness. Ask your health care provider about:  The  right schedule for you to have regular tests and exams.  Things you can do on your own to prevent diseases and keep yourself healthy. What should I know about diet, weight, and exercise? Eat a healthy diet   Eat a diet that includes plenty of vegetables, fruits, low-fat dairy products, and lean protein.  Do not eat a lot of foods that are high in solid fats, added sugars, or sodium. Maintain a healthy weight Body mass index (BMI) is used to identify weight problems. It estimates body fat based on height and weight. Your health care provider can help determine your BMI and help you achieve or maintain a healthy weight. Get regular exercise Get regular exercise. This is one of the most important things you can do for your health. Most adults should:  Exercise for at least 150 minutes each week. The exercise should increase your heart rate and make you sweat (moderate-intensity exercise).  Do strengthening exercises at least twice a week. This is in addition to the moderate-intensity exercise.  Spend less time sitting. Even light physical activity can be beneficial. Watch cholesterol and blood lipids Have your blood tested for lipids and cholesterol at 52 years of age, then have this test every 5 years. Have your cholesterol levels checked more often if:  Your lipid or cholesterol levels are high.  You are older than 52 years of age.  You are at high risk for heart disease. What should I know about cancer screening? Depending on your health history and family  history, you may need to have cancer screening at various ages. This may include screening for:  Breast cancer.  Cervical cancer.  Colorectal cancer.  Skin cancer.  Lung cancer. What should I know about heart disease, diabetes, and high blood pressure? Blood pressure and heart disease  High blood pressure causes heart disease and increases the risk of stroke. This is more likely to develop in people who have high blood pressure readings, are of African descent, or are overweight.  Have your blood pressure checked: ? Every 3-5 years if you are 29-31 years of age. ? Every year if you are 29 years old or older. Diabetes Have regular diabetes screenings. This checks your fasting blood sugar level. Have the screening done:  Once every three years after age 29 if you are at a normal weight and have a low risk for diabetes.  More often and at a younger age if you are overweight or have a high risk for diabetes. What should I know about preventing infection? Hepatitis B If you have a higher risk for hepatitis B, you should be screened for this virus. Talk with your health care provider to find out if you are at risk for hepatitis B infection. Hepatitis C Testing is recommended for:  Everyone born from [birth year] through 1965.  Anyone with known risk factors for hepatitis C. Sexually transmitted infections (STIs)  Get screened for STIs, including gonorrhea and chlamydia, if: ? You are sexually active and are younger than 52 years of age. ? You are older than 52 years of age and your health care provider tells you that you are at risk for this type of infection. ? Your sexual activity has changed since you were last screened, and you are at increased risk for chlamydia or gonorrhea. Ask your health care provider if you are at risk.  Ask your health care provider about whether you are at high risk for HIV. Your health care provider may recommend a  prescription medicine to help prevent HIV  infection. If you choose to take medicine to prevent HIV, you should first get tested for HIV. You should then be tested every 3 months for as long as you are taking the medicine. Pregnancy  If you are about to stop having your period (premenopausal) and you may become pregnant, seek counseling before you get pregnant.  Take 400 to 800 micrograms (mcg) of folic acid every day if you become pregnant.  Ask for birth control (contraception) if you want to prevent pregnancy. Osteoporosis and menopause Osteoporosis is a disease in which the bones lose minerals and strength with aging. This can result in bone fractures. If you are 54 years old or older, or if you are at risk for osteoporosis and fractures, ask your health care provider if you should:  Be screened for bone loss.  Take a calcium or vitamin D supplement to lower your risk of fractures.  Be given hormone replacement therapy (HRT) to treat symptoms of menopause. Follow these instructions at home: Lifestyle  Do not use any products that contain nicotine or tobacco, such as cigarettes, e-cigarettes, and chewing tobacco. If you need help quitting, ask your health care provider.  Do not use street drugs.  Do not share needles.  Ask your health care provider for help if you need support or information about quitting drugs. Alcohol use  Do not drink alcohol if: ? Your health care provider tells you not to drink. ? You are pregnant, may be pregnant, or are planning to become pregnant.  If you drink alcohol: ? Limit how much you use to 0-1 drink a day. ? Limit intake if you are breastfeeding.  Be aware of how much alcohol is in your drink. In the [place] one drink equals one 12 oz bottle of beer (355 mL), one 5 oz glass of wine (148 mL), or one 1 oz glass of hard liquor (44 mL). General instructions  Schedule regular health, dental, and eye exams.  Stay current with your vaccines.  Tell your health care provider if: ? You  often feel depressed. ? You have ever been abused or do not feel safe at home. Summary  Adopting a healthy lifestyle and getting preventive care are important in promoting health and wellness.  Follow your health care provider's instructions about healthy diet, exercising, and getting tested or screened for diseases.  Follow your health care provider's instructions on monitoring your cholesterol and blood pressure. This information is not intended to replace advice given to you by your health care provider. Make sure you discuss any questions you have with your health care provider. Document Released: 03/20/2011 Document Revised: 08/28/2018 Document Reviewed: 08/28/2018 Elsevier Patient Education  2020 Elsevier Inc.      Agustina Caroli, MD [facility]

## 2019-08-19 ENCOUNTER — Encounter: Payer: Self-pay | Admitting: Emergency Medicine

## 2019-08-19 LAB — CBC WITH DIFFERENTIAL/PLATELET
Basophils Absolute: 0 10*3/uL (ref 0.0–0.2)
Basos: 0 %
EOS (ABSOLUTE): 0 10*3/uL (ref 0.0–0.4)
Eos: 0 %
Hematocrit: 39.1 % (ref 34.0–46.6)
Hemoglobin: 12.8 g/dL (ref 11.1–15.9)
Immature Grans (Abs): 0 10*3/uL (ref 0.0–0.1)
Immature Granulocytes: 0 %
Lymphocytes Absolute: 1 10*3/uL (ref 0.7–3.1)
Lymphs: 25 %
MCH: 30.1 pg (ref 26.6–33.0)
MCHC: 32.7 g/dL (ref 31.5–35.7)
MCV: 92 fL (ref 79–97)
Monocytes Absolute: 0.2 10*3/uL (ref 0.1–0.9)
Monocytes: 6 %
Neutrophils Absolute: 2.8 10*3/uL (ref 1.4–7.0)
Neutrophils: 69 %
Platelets: 223 10*3/uL (ref 150–450)
RBC: 4.25 x10E6/uL (ref 3.77–5.28)
RDW: 12.1 % (ref 11.7–15.4)
WBC: 4.1 10*3/uL (ref 3.4–10.8)

## 2019-08-19 LAB — COMPREHENSIVE METABOLIC PANEL
ALT: 31 IU/L (ref 0–32)
AST: 32 IU/L (ref 0–40)
Albumin/Globulin Ratio: 1.5 (ref 1.2–2.2)
Albumin: 4.1 g/dL (ref 3.8–4.9)
Alkaline Phosphatase: 98 IU/L (ref 39–117)
BUN/Creatinine Ratio: 11 (ref 9–23)
BUN: 9 mg/dL (ref 6–24)
Bilirubin Total: 1.3 mg/dL — ABNORMAL HIGH (ref 0.0–1.2)
CO2: 24 mmol/L (ref 20–29)
Calcium: 9.4 mg/dL (ref 8.7–10.2)
Chloride: 104 mmol/L (ref 96–106)
Creatinine, Ser: 0.79 mg/dL (ref 0.57–1.00)
GFR calc Af Amer: 100 mL/min/{1.73_m2} (ref >59)
GFR calc non Af Amer: 86 mL/min/{1.73_m2} (ref >59)
Globulin, Total: 2.7 g/dL (ref 1.5–4.5)
Glucose: 88 mg/dL (ref 65–99)
Potassium: 4 mmol/L (ref 3.5–5.2)
Sodium: 144 mmol/L (ref 134–144)
Total Protein: 6.8 g/dL (ref 6.0–8.5)

## 2019-08-19 LAB — LIPID PANEL
Chol/HDL Ratio: 1.9 ratio (ref 0.0–4.4)
Cholesterol, Total: 202 mg/dL — ABNORMAL HIGH (ref 100–199)
HDL: 105 mg/dL (ref >39)
LDL Chol Calc (NIH): 84 mg/dL (ref 0–99)
Triglycerides: 70 mg/dL (ref 0–149)
VLDL Cholesterol Cal: 13 mg/dL (ref 5–40)

## 2019-09-05 ENCOUNTER — Ambulatory Visit (INDEPENDENT_AMBULATORY_CARE_PROVIDER_SITE_OTHER): Payer: BC Managed Care – PPO | Admitting: Gynecology

## 2019-09-05 ENCOUNTER — Encounter: Payer: BLUE CROSS/BLUE SHIELD | Admitting: Gynecology

## 2019-09-05 ENCOUNTER — Encounter: Payer: Self-pay | Admitting: Gynecology

## 2019-09-05 ENCOUNTER — Other Ambulatory Visit: Payer: Self-pay

## 2019-09-05 VITALS — BP 118/74 | Ht 65.0 in | Wt 118.0 lb

## 2019-09-05 DIAGNOSIS — Z01419 Encounter for gynecological examination (general) (routine) without abnormal findings: Secondary | ICD-10-CM | POA: Diagnosis not present

## 2019-09-05 DIAGNOSIS — N952 Postmenopausal atrophic vaginitis: Secondary | ICD-10-CM | POA: Diagnosis not present

## 2019-09-05 NOTE — Patient Instructions (Addendum)
Schedule your colonoscopy with:  [facility]   Address: [address], [place], [place] [address]  [phone]  Schedule your mammography in January when you are due.  Follow-up in 1 year for annual exam, sooner if any issues.

## 2019-09-05 NOTE — Progress Notes (Signed)
    DOSHA BROSHEARS [birth year] [id number]        53 y.o.  G2P1011 for annual gynecologic exam.  Without gynecologic complaints.  Past medical history,surgical history, problem list, medications, allergies, family history and social history were all reviewed and documented as reviewed in the EPIC chart.  ROS:  Performed with pertinent positives and negatives included in the history, assessment and plan.   Additional significant findings : None   Exam: Caryn Bee assistant Vitals:   09/05/19 1453  BP: 118/74  Weight: 118 lb (53.5 kg)  Height: 5\' 5"  (1.651 m)   Body mass index is 19.64 kg/m.  General appearance:  Normal affect, orientation and appearance. Skin: Grossly normal HEENT: Without gross lesions.  No cervical or supraclavicular adenopathy. Thyroid normal.  Lungs:  Clear without wheezing, rales or rhonchi Cardiac: RR, without RMG Abdominal:  Soft, nontender, without masses, guarding, rebound, organomegaly or hernia Breasts:  Examined lying and sitting without masses, retractions, discharge or axillary adenopathy. Pelvic:  Ext, BUS, Vagina: Normal with atrophic changes  Cervix: Normal with atrophic changes  Uterus: Anteverted, normal size, shape and contour, midline and mobile nontender   Adnexa: Without masses or tenderness    Anus and perineum: Normal   Rectovaginal: Normal sphincter tone without palpated masses or tenderness.    Assessment/Plan:  53 y.o. G58P1011 female for annual gynecologic exam.   1. Postmenopausal/atrophic genital changes.  [facility] but admits to not using it regularly.  Has been having issues with vaginal dryness and would like to start using it on a regular basis.  We have discussed the risks to include absorption with systemic effects such as thrombosis, endometrial stimulation and breast cancer issues.  Refill x1 year provided. 2. Pap smear/HPV 2019.  No Pap smear done today.  No history of  abnormal Pap smears.  Plan repeat Pap smear/HPV at 5-year interval per current screening guidelines. 3. Mammography 09/2018.  Continue with annual mammography next month.  Breast exam normal today. 4. Colonoscopy never.  Recommend screening colonoscopy.  Names and numbers provided. 5. DEXA never.  Will plan further into the menopause. 6. Health maintenance.  No routine lab work done as patient does this elsewhere.  Follow-up 1 year, sooner as needed.   Anastasio Auerbach MD, 3:20 PM 09/05/2019

## 2019-09-08 ENCOUNTER — Telehealth: Payer: Self-pay | Admitting: *Deleted

## 2019-09-08 MED ORDER — NONFORMULARY OR COMPOUNDED ITEM: 4 refills | Status: DC

## 2019-09-08 NOTE — Telephone Encounter (Signed)
-----   Message from Anastasio Auerbach, MD sent at 09/05/2019  3:23 PM EST ----- Call in refill for vaginal estradiol cream prefilled syringes twice weekly 28-month supply refill x1 year to [facility]

## 2019-09-08 NOTE — Telephone Encounter (Signed)
Rx called in 

## 2019-09-24 ENCOUNTER — Ambulatory Visit
Admission: RE | Admit: 2019-09-24 | Discharge: 2019-09-24 | Disposition: A | Discharge status: Home / Self Care | Payer: BC Managed Care – PPO | Source: Ambulatory Visit | Attending: Emergency Medicine | Admitting: Emergency Medicine

## 2019-09-24 ENCOUNTER — Other Ambulatory Visit: Payer: Self-pay

## 2019-09-24 ENCOUNTER — Encounter: Payer: Self-pay | Admitting: Emergency Medicine

## 2019-09-24 DIAGNOSIS — Z1231 Encounter for screening mammogram for malignant neoplasm of breast: Secondary | ICD-10-CM | POA: Diagnosis not present

## 2019-10-07 ENCOUNTER — Encounter: Payer: BLUE CROSS/BLUE SHIELD | Admitting: Gynecology

## 2020-08-19 ENCOUNTER — Ambulatory Visit (INDEPENDENT_AMBULATORY_CARE_PROVIDER_SITE_OTHER): Payer: BC Managed Care – PPO | Admitting: Emergency Medicine

## 2020-08-19 ENCOUNTER — Encounter: Payer: Self-pay | Admitting: Emergency Medicine

## 2020-08-19 ENCOUNTER — Other Ambulatory Visit: Payer: Self-pay

## 2020-08-19 VITALS — BP 116/72 | HR 60 | Temp 97.8°F | Resp 16 | Ht 65.0 in | Wt 115.0 lb

## 2020-08-19 DIAGNOSIS — Z23 Encounter for immunization: Secondary | ICD-10-CM | POA: Diagnosis not present

## 2020-08-19 DIAGNOSIS — Z1211 Encounter for screening for malignant neoplasm of colon: Secondary | ICD-10-CM

## 2020-08-19 DIAGNOSIS — Z13220 Encounter for screening for lipoid disorders: Secondary | ICD-10-CM | POA: Diagnosis not present

## 2020-08-19 DIAGNOSIS — Z13228 Encounter for screening for other metabolic disorders: Secondary | ICD-10-CM | POA: Diagnosis not present

## 2020-08-19 DIAGNOSIS — Z1329 Encounter for screening for other suspected endocrine disorder: Secondary | ICD-10-CM | POA: Diagnosis not present

## 2020-08-19 DIAGNOSIS — Z130 Encounter for screening for diseases of the blood and blood-forming organs and certain disorders involving the immune mechanism: Secondary | ICD-10-CM

## 2020-08-19 DIAGNOSIS — Z0000 Encounter for general adult medical examination without abnormal findings: Secondary | ICD-10-CM

## 2020-08-19 NOTE — Progress Notes (Signed)
Carolyn Woodward 54 y.o.   Chief Complaint  Patient presents with   Annual Exam    HISTORY OF PRESENT ILLNESS: This is a 54 y.o. female here for annual exam. Healthy female with a healthy lifestyle. No chronic medical problems.  No chronic medications. Exercises regularly. Has no complaints or medical concerns today. Fully vaccinated against Covid. Up-to-date with mammogram.  HPI   Prior to Admission medications   Medication Sig Start Date End Date Taking? Authorizing Provider  IRON PO Take by mouth.   Yes [provider]  Multiple Vitamin (MULTIVITAMIN) capsule Take 1 capsule by mouth daily.     Yes [provider]  Probiotic Product (PROBIOTIC PO) Take by mouth.   Yes [provider]  NONFORMULARY OR COMPOUNDED ITEM Estradiol vaginal cream 0.02% insert one applicator twice weekly Patient not taking: Reported on 08/19/2020 09/08/19   Fontaine, Nadyne Coombes, MD    No Known Allergies  There are no problems to display for this patient.   No past medical history on file.  No past surgical history on file.  Social History   Socioeconomic History   Marital status: Married    Spouse name: Not on file   Number of children: Not on file   Years of education: Not on file   Highest education level: Not on file  Occupational History   Occupation: Location manager  Tobacco Use   Smoking status: Never Smoker   Smokeless tobacco: Never Used  Vaping Use   Vaping Use: Never used  Substance and Sexual Activity   Alcohol use: Yes    Alcohol/week: 0.0 standard drinks    Comment: rare    Drug use: No   Sexual activity: Yes    Birth control/protection: Post-menopausal    Comment: 1st intercourse 54 yo-Fewer than 5 partners  Other Topics Concern   Not on file  Social History Narrative   Not on file   Social Determinants of Health   Financial Resource Strain:    Difficulty of Paying Living Expenses: Not on file  Food Insecurity:     Worried About Programme researcher, broadcasting/film/video in the Last Year: Not on file   [employer] of Food in the Last Year: Not on file  Transportation Needs:    Lack of Transportation (Medical): Not on file   Lack of Transportation (Non-Medical): Not on file  Physical Activity:    Days of Exercise per Week: Not on file   Minutes of Exercise per Session: Not on file  Stress:    Feeling of Stress : Not on file  Social Connections:    Frequency of Communication with Friends and Family: Not on file   Frequency of Social Gatherings with Friends and Family: Not on file   Attends Religious Services: Not on file   Active Member of Clubs or Organizations: Not on file   Attends Banker Meetings: Not on file   Marital Status: Not on file  Intimate Partner Violence:    Fear of Current or Ex-Partner: Not on file   Emotionally Abused: Not on file   Physically Abused: Not on file   Sexually Abused: Not on file    Family History  Problem Relation Age of Onset   Heart attack Maternal Grandfather    Breast cancer Neg Hx      Review of Systems  Constitutional: Negative.  Negative for chills and fever.  HENT: Negative.  Negative for congestion and sore throat.   Respiratory: Negative.  Negative for cough and shortness of breath.   Cardiovascular: Negative.  Negative for chest pain and palpitations.  Gastrointestinal: Negative.  Negative for abdominal pain, blood in stool, diarrhea, melena, nausea and vomiting.  Genitourinary: Negative.  Negative for dysuria, flank pain, frequency and hematuria.  Musculoskeletal: Negative.  Negative for joint pain and myalgias.  Skin: Negative.  Negative for rash.  Neurological: Negative.  Negative for dizziness, focal weakness, seizures, weakness and headaches.  All other systems reviewed and are negative.   Today's Vitals   08/19/20 1356  BP: 116/72  Pulse: 60  Resp: 16  Temp: 97.8 F (36.6 C)  TempSrc: Temporal  SpO2: 100%  Weight: 115 lb  (52.2 kg)  Height: 5\' 5"  (1.651 m)   Body mass index is 19.14 kg/m.  Physical Exam Vitals reviewed.  Constitutional:      Appearance: Normal appearance.  HENT:     Head: Normocephalic.  Eyes:     Extraocular Movements: Extraocular movements intact.     Conjunctiva/sclera: Conjunctivae normal.     Pupils: Pupils are equal, round, and reactive to light.  Cardiovascular:     Rate and Rhythm: Normal rate and regular rhythm.     Pulses: Normal pulses.     Heart sounds: Normal heart sounds.  Pulmonary:     Effort: Pulmonary effort is normal.     Breath sounds: Normal breath sounds.  Abdominal:     General: Bowel sounds are normal. There is no distension.     Palpations: Abdomen is soft. There is no mass.     Tenderness: There is no abdominal tenderness.  Musculoskeletal:        General: Normal range of motion.     Cervical back: Normal range of motion and neck supple.  Skin:    General: Skin is warm and dry.     Capillary Refill: Capillary refill takes less than 2 seconds.  Neurological:     General: No focal deficit present.     Mental Status: She is alert and oriented to person, place, and time.  Psychiatric:        Mood and Affect: Mood normal.        Behavior: Behavior normal.      ASSESSMENT & PLAN: Carolyn Woodward was seen today for annual exam.  Diagnoses and all orders for this visit:  Routine general medical examination at a health care facility  Need for prophylactic vaccination and inoculation against influenza -     Flu Vaccine QUAD 36+ mos IM  Colon cancer screening -     Ambulatory referral to Gastroenterology  Screening for deficiency anemia -     CBC with Differential/Platelet  Screening for lipoid disorders -     Lipid panel  Screening for endocrine, metabolic and immunity disorder -     Comprehensive metabolic panel    Patient Instructions       If you have lab work done today you will be contacted with your lab results within the next 2  weeks.  If you have not heard from Carolyn Woodward then please contact us. The fastest way to get your results is to register for My Chart.   IF you received an x-ray today, you will receive an invoice from [facility]. Please contact [facility] at [phone] with questions or concerns regarding your invoice.   IF you received labwork today, you will receive an invoice from [place] [facility]. Please contact [facility] at [phone] with questions or concerns regarding your invoice.   Our billing  staff will not be able to assist you with questions regarding bills from these companies.  You will be contacted with the lab results as soon as they are available. The fastest way to get your results is to activate your My Chart account. Instructions are located on the last page of this paperwork. If you have not heard from [place] regarding the results in 2 weeks, please contact this office.      Health Maintenance, Female Adopting a healthy lifestyle and getting preventive care are important in promoting health and wellness. Ask your health care provider about:  The right schedule for you to have regular tests and exams.  Things you can do on your own to prevent diseases and keep yourself healthy. What should I know about diet, weight, and exercise? Eat a healthy diet   Eat a diet that includes plenty of vegetables, fruits, low-fat dairy products, and lean protein.  Do not eat a lot of foods that are high in solid fats, added sugars, or sodium. Maintain a healthy weight Body mass index (BMI) is used to identify weight problems. It estimates body fat based on height and weight. Your health care provider can help determine your BMI and help you achieve or maintain a healthy weight. Get regular exercise Get regular exercise. This is one of the most important things you can do for your health. Most adults should:  Exercise for at least 150 minutes each week. The exercise should increase your heart  rate and make you sweat (moderate-intensity exercise).  Do strengthening exercises at least twice a week. This is in addition to the moderate-intensity exercise.  Spend less time sitting. Even light physical activity can be beneficial. Watch cholesterol and blood lipids Have your blood tested for lipids and cholesterol at 54 years of age, then have this test every 5 years. Have your cholesterol levels checked more often if:  Your lipid or cholesterol levels are high.  You are older than 53 years of age.  You are at high risk for heart disease. What should I know about cancer screening? Depending on your health history and family history, you may need to have cancer screening at various ages. This may include screening for:  Breast cancer.  Cervical cancer.  Colorectal cancer.  Skin cancer.  Lung cancer. What should I know about heart disease, diabetes, and high blood pressure? Blood pressure and heart disease  High blood pressure causes heart disease and increases the risk of stroke. This is more likely to develop in people who have high blood pressure readings, are of African descent, or are overweight.  Have your blood pressure checked: ? Every 3-5 years if you are 73-2 years of age. ? Every year if you are 49 years old or older. Diabetes Have regular diabetes screenings. This checks your fasting blood sugar level. Have the screening done:  Once every three years after age 26 if you are at a normal weight and have a low risk for diabetes.  More often and at a younger age if you are overweight or have a high risk for diabetes. What should I know about preventing infection? Hepatitis B If you have a higher risk for hepatitis B, you should be screened for this virus. Talk with your health care provider to find out if you are at risk for hepatitis B infection. Hepatitis C Testing is recommended for:  Everyone born from [birth year] through 1965.  Anyone with known risk factors  for hepatitis C. Sexually transmitted infections (  STIs)  Get screened for STIs, including gonorrhea and chlamydia, if: ? You are sexually active and are younger than 53 years of age. ? You are older than 53 years of age and your health care provider tells you that you are at risk for this type of infection. ? Your sexual activity has changed since you were last screened, and you are at increased risk for chlamydia or gonorrhea. Ask your health care provider if you are at risk.  Ask your health care provider about whether you are at high risk for HIV. Your health care provider may recommend a prescription medicine to help prevent HIV infection. If you choose to take medicine to prevent HIV, you should first get tested for HIV. You should then be tested every 3 months for as long as you are taking the medicine. Pregnancy  If you are about to stop having your period (premenopausal) and you may become pregnant, seek counseling before you get pregnant.  Take 400 to 800 micrograms (mcg) of folic acid every day if you become pregnant.  Ask for birth control (contraception) if you want to prevent pregnancy. Osteoporosis and menopause Osteoporosis is a disease in which the bones lose minerals and strength with aging. This can result in bone fractures. If you are 258 years old or older, or if you are at risk for osteoporosis and fractures, ask your health care provider if you should:  Be screened for bone loss.  Take a calcium or vitamin D supplement to lower your risk of fractures.  Be given hormone replacement therapy (HRT) to treat symptoms of menopause. Follow these instructions at home: Lifestyle  Do not use any products that contain nicotine or tobacco, such as cigarettes, e-cigarettes, and chewing tobacco. If you need help quitting, ask your health care provider.  Do not use street drugs.  Do not share needles.  Ask your health care provider for help if you need support or information  about quitting drugs. Alcohol use  Do not drink alcohol if: ? Your health care provider tells you not to drink. ? You are pregnant, may be pregnant, or are planning to become pregnant.  If you drink alcohol: ? Limit how much you use to 0-1 drink a day. ? Limit intake if you are breastfeeding.  Be aware of how much alcohol is in your drink. In the [place] one drink equals one 12 oz bottle of beer (355 mL), one 5 oz glass of wine (148 mL), or one 1 oz glass of hard liquor (44 mL). General instructions  Schedule regular health, dental, and eye exams.  Stay current with your vaccines.  Tell your health care provider if: ? You often feel depressed. ? You have ever been abused or do not feel safe at home. Summary  Adopting a healthy lifestyle and getting preventive care are important in promoting health and wellness.  Follow your health care provider's instructions about healthy diet, exercising, and getting tested or screened for diseases.  Follow your health care provider's instructions on monitoring your cholesterol and blood pressure. This information is not intended to replace advice given to you by your health care provider. Make sure you discuss any questions you have with your health care provider. Document Revised: 08/28/2018 Document Reviewed: 08/28/2018 Elsevier Patient Education  2020 Elsevier Inc.      Edwina BarthMiguel Delmo Matty, MD [facility]

## 2020-08-19 NOTE — Patient Instructions (Addendum)
   If you have lab work done today you will be contacted with your lab results within the next 2 weeks.  If you have not heard from us then please contact us. The fastest way to get your results is to register for My Chart.   IF you received an x-ray today, you will receive an invoice from [facility]. Please contact [facility] at [phone] with questions or concerns regarding your invoice.   IF you received labwork today, you will receive an invoice from [facility]. Please contact [facility] at [phone] with questions or concerns regarding your invoice.   Our billing staff will not be able to assist you with questions regarding bills from these companies.  You will be contacted with the lab results as soon as they are available. The fastest way to get your results is to activate your My Chart account. Instructions are located on the last page of this paperwork. If you have not heard from us regarding the results in 2 weeks, please contact this office.       Health Maintenance, Female Adopting a healthy lifestyle and getting preventive care are important in promoting health and wellness. Ask your health care provider about:  The right schedule for you to have regular tests and exams.  Things you can do on your own to prevent diseases and keep yourself healthy. What should I know about diet, weight, and exercise? Eat a healthy diet   Eat a diet that includes plenty of vegetables, fruits, low-fat dairy products, and lean protein.  Do not eat a lot of foods that are high in solid fats, added sugars, or sodium. Maintain a healthy weight Body mass index (BMI) is used to identify weight problems. It estimates body fat based on height and weight. Your health care provider can help determine your BMI and help you achieve or maintain a healthy weight. Get regular exercise Get regular exercise. This is one of the most important things you can do for your health. Most  adults should:  Exercise for at least 150 minutes each week. The exercise should increase your heart rate and make you sweat (moderate-intensity exercise).  Do strengthening exercises at least twice a week. This is in addition to the moderate-intensity exercise.  Spend less time sitting. Even light physical activity can be beneficial. Watch cholesterol and blood lipids Have your blood tested for lipids and cholesterol at 54 years of age, then have this test every 5 years. Have your cholesterol levels checked more often if:  Your lipid or cholesterol levels are high.  You are older than 53 years of age.  You are at high risk for heart disease. What should I know about cancer screening? Depending on your health history and family history, you may need to have cancer screening at various ages. This may include screening for:  Breast cancer.  Cervical cancer.  Colorectal cancer.  Skin cancer.  Lung cancer. What should I know about heart disease, diabetes, and high blood pressure? Blood pressure and heart disease  High blood pressure causes heart disease and increases the risk of stroke. This is more likely to develop in people who have high blood pressure readings, are of African descent, or are overweight.  Have your blood pressure checked: ? Every 3-5 years if you are 18-39 years of age. ? Every year if you are 40 years old or older. Diabetes Have regular diabetes screenings. This checks your fasting blood sugar level. Have the screening done:  Once   every three years after age 40 if you are at a normal weight and have a low risk for diabetes.  More often and at a younger age if you are overweight or have a high risk for diabetes. What should I know about preventing infection? Hepatitis B If you have a higher risk for hepatitis B, you should be screened for this virus. Talk with your health care provider to find out if you are at risk for hepatitis B infection. Hepatitis  C Testing is recommended for:  Everyone born from 1945 through 1965.  Anyone with known risk factors for hepatitis C. Sexually transmitted infections (STIs)  Get screened for STIs, including gonorrhea and chlamydia, if: ? You are sexually active and are younger than 53 years of age. ? You are older than 53 years of age and your health care provider tells you that you are at risk for this type of infection. ? Your sexual activity has changed since you were last screened, and you are at increased risk for chlamydia or gonorrhea. Ask your health care provider if you are at risk.  Ask your health care provider about whether you are at high risk for HIV. Your health care provider may recommend a prescription medicine to help prevent HIV infection. If you choose to take medicine to prevent HIV, you should first get tested for HIV. You should then be tested every 3 months for as long as you are taking the medicine. Pregnancy  If you are about to stop having your period (premenopausal) and you may become pregnant, seek counseling before you get pregnant.  Take 400 to 800 micrograms (mcg) of folic acid every day if you become pregnant.  Ask for birth control (contraception) if you want to prevent pregnancy. Osteoporosis and menopause Osteoporosis is a disease in which the bones lose minerals and strength with aging. This can result in bone fractures. If you are 65 years old or older, or if you are at risk for osteoporosis and fractures, ask your health care provider if you should:  Be screened for bone loss.  Take a calcium or vitamin D supplement to lower your risk of fractures.  Be given hormone replacement therapy (HRT) to treat symptoms of menopause. Follow these instructions at home: Lifestyle  Do not use any products that contain nicotine or tobacco, such as cigarettes, e-cigarettes, and chewing tobacco. If you need help quitting, ask your health care provider.  Do not use street  drugs.  Do not share needles.  Ask your health care provider for help if you need support or information about quitting drugs. Alcohol use  Do not drink alcohol if: ? Your health care provider tells you not to drink. ? You are pregnant, may be pregnant, or are planning to become pregnant.  If you drink alcohol: ? Limit how much you use to 0-1 drink a day. ? Limit intake if you are breastfeeding.  Be aware of how much alcohol is in your drink. In the [place] one drink equals one 12 oz bottle of beer (355 mL), one 5 oz glass of wine (148 mL), or one 1 oz glass of hard liquor (44 mL). General instructions  Schedule regular health, dental, and eye exams.  Stay current with your vaccines.  Tell your health care provider if: ? You often feel depressed. ? You have ever been abused or do not feel safe at home. Summary  Adopting a healthy lifestyle and getting preventive care are important in promoting health and   wellness.  Follow your health care provider's instructions about healthy diet, exercising, and getting tested or screened for diseases.  Follow your health care provider's instructions on monitoring your cholesterol and blood pressure. This information is not intended to replace advice given to you by your health care provider. Make sure you discuss any questions you have with your health care provider. Document Revised: 08/28/2018 Document Reviewed: 08/28/2018 Elsevier Patient Education  2020 Elsevier Inc.  

## 2020-08-20 LAB — COMPREHENSIVE METABOLIC PANEL
ALT: 31 IU/L (ref 0–32)
AST: 30 IU/L (ref 0–40)
Albumin/Globulin Ratio: 1.9 (ref 1.2–2.2)
Albumin: 4.6 g/dL (ref 3.8–4.9)
Alkaline Phosphatase: 123 IU/L — ABNORMAL HIGH (ref 44–121)
BUN/Creatinine Ratio: 20 (ref 9–23)
BUN: 14 mg/dL (ref 6–24)
Bilirubin Total: 1.7 mg/dL — ABNORMAL HIGH (ref 0.0–1.2)
CO2: 26 mmol/L (ref 20–29)
Calcium: 9.7 mg/dL (ref 8.7–10.2)
Chloride: 103 mmol/L (ref 96–106)
Creatinine, Ser: 0.7 mg/dL (ref 0.57–1.00)
GFR calc Af Amer: 114 mL/min/{1.73_m2} (ref >59)
GFR calc non Af Amer: 99 mL/min/{1.73_m2} (ref >59)
Globulin, Total: 2.4 g/dL (ref 1.5–4.5)
Glucose: 91 mg/dL (ref 65–99)
Potassium: 4.5 mmol/L (ref 3.5–5.2)
Sodium: 143 mmol/L (ref 134–144)
Total Protein: 7 g/dL (ref 6.0–8.5)

## 2020-08-20 LAB — LIPID PANEL
Chol/HDL Ratio: 1.9 ratio (ref 0.0–4.4)
Cholesterol, Total: 188 mg/dL (ref 100–199)
HDL: 97 mg/dL (ref >39)
LDL Chol Calc (NIH): 80 mg/dL (ref 0–99)
Triglycerides: 59 mg/dL (ref 0–149)
VLDL Cholesterol Cal: 11 mg/dL (ref 5–40)

## 2020-08-20 LAB — CBC WITH DIFFERENTIAL/PLATELET
Basophils Absolute: 0 10*3/uL (ref 0.0–0.2)
Basos: 0 %
EOS (ABSOLUTE): 0 10*3/uL (ref 0.0–0.4)
Eos: 0 %
Hematocrit: 42.1 % (ref 34.0–46.6)
Hemoglobin: 13.8 g/dL (ref 11.1–15.9)
Immature Grans (Abs): 0 10*3/uL (ref 0.0–0.1)
Immature Granulocytes: 0 %
Lymphocytes Absolute: 1.3 10*3/uL (ref 0.7–3.1)
Lymphs: 28 %
MCH: 30.3 pg (ref 26.6–33.0)
MCHC: 32.8 g/dL (ref 31.5–35.7)
MCV: 92 fL (ref 79–97)
Monocytes Absolute: 0.3 10*3/uL (ref 0.1–0.9)
Monocytes: 6 %
Neutrophils Absolute: 3 10*3/uL (ref 1.4–7.0)
Neutrophils: 66 %
Platelets: 211 10*3/uL (ref 150–450)
RBC: 4.56 x10E6/uL (ref 3.77–5.28)
RDW: 12 % (ref 11.7–15.4)
WBC: 4.6 10*3/uL (ref 3.4–10.8)

## 2020-09-06 ENCOUNTER — Encounter: Payer: BC Managed Care – PPO | Admitting: Obstetrics and Gynecology

## 2020-09-07 ENCOUNTER — Encounter: Payer: Self-pay | Admitting: Obstetrics and Gynecology

## 2020-09-07 ENCOUNTER — Ambulatory Visit (INDEPENDENT_AMBULATORY_CARE_PROVIDER_SITE_OTHER): Payer: BC Managed Care – PPO | Admitting: Obstetrics and Gynecology

## 2020-09-07 ENCOUNTER — Other Ambulatory Visit: Payer: Self-pay

## 2020-09-07 VITALS — BP 116/74 | Ht 65.0 in | Wt 117.0 lb

## 2020-09-07 DIAGNOSIS — N951 Menopausal and female climacteric states: Secondary | ICD-10-CM | POA: Diagnosis not present

## 2020-09-07 DIAGNOSIS — Z01419 Encounter for gynecological examination (general) (routine) without abnormal findings: Secondary | ICD-10-CM | POA: Diagnosis not present

## 2020-09-07 DIAGNOSIS — N952 Postmenopausal atrophic vaginitis: Secondary | ICD-10-CM | POA: Diagnosis not present

## 2020-09-07 NOTE — Progress Notes (Signed)
   Carolyn Woodward [birth year] [id number]  SUBJECTIVE:  54 y.o. G45P1011 female here for a breast and pelvic exam and Pap smear. Having some ongoing vaginal dryness and felt that the compounded vaginal estrogen cream did not really help using it twice weekly.  She has no gynecologic concerns.   Current Outpatient Medications  Medication Sig Dispense Refill  . IRON PO Take by mouth.    . Multiple Vitamin (MULTIVITAMIN) capsule Take 1 capsule by mouth daily.    . Probiotic Product (PROBIOTIC PO) Take by mouth.     No current facility-administered medications for this visit.   Allergies: Patient has no known allergies.  Patient's last menstrual period was 07/14/2016.  Past medical history,surgical history, problem list, medications, allergies, family history and social history were all reviewed and documented as reviewed in the EPIC chart.  GYN ROS: no abnormal bleeding, pelvic pain or discharge, no breast pain or new or enlarging lumps on self exam.  No dysuria, urinary frequency, pain with urination, cloudy/malodorous urine.   OBJECTIVE:  BP 116/74   Ht 5\' 5"  (1.651 m)   Wt 117 lb (53.1 kg)   LMP 07/14/2016   BMI 19.47 kg/m  The patient appears well, alert, oriented, in no distress. Heart with regular rate and rhythm Lungs are clear to auscultation bilaterally BREAST EXAM: breasts appear normal, no suspicious masses, no skin or nipple changes or axillary nodes PELVIC EXAM: VULVA: normal appearing vulva with atrophic change, no masses, tenderness or lesions, VAGINA: normal appearing vagina with atrophic change, normal color and discharge, no lesions, CERVIX: normal appearing atrophic cervix without discharge or lesions, UTERUS: uterus is normal size, shape, consistency and nontender, ADNEXA: normal adnexa in size, nontender and no masses  Chaperone: 07/16/2016 present during the examination  ASSESSMENT:  54 y.o. G2P1011 here for a breast and pelvic exam  PLAN:   1.  Postmenopausal.  Vaginal dryness is her main concern.  We discussed and reviewed use of lubricants silicone or water-based for improved comfort with sexual activity, and a vaginal moisturizer such as Replens for moisture maintenance.  Also reviewed possibility of restarting vaginal estrogen cream whether compounded or scription for manufactured creams.  She would like to just try lubricants for now and will let 40 know if she needs anything additional for symptom control. 2. Pap smear/HPV 2019.  No significant history of abnormal Pap smears.  Next Pap smear due 2024 following the current guidelines recommending the 5 year interval. 3. Mammogram 09/2019.  Normal breast exam today.  She is reminded to schedule an annual mammogram when due. 4. Colonoscopy never.  We discussed the importance of colon cancer screening.  She says she does have a referral from her primary doctor and she just needs to go ahead and schedule the colonoscopy which she plans to do soon. 5. DEXA never.  Would recommend when further into menopause around age 27 6. Health maintenance.  No labs today as she normally has these completed elsewhere.  Return annually or sooner, prn.  70 11 MD 09/07/20

## 2020-10-14 ENCOUNTER — Other Ambulatory Visit: Payer: Self-pay | Admitting: Emergency Medicine

## 2020-10-14 DIAGNOSIS — Z0000 Encounter for general adult medical examination without abnormal findings: Secondary | ICD-10-CM

## 2020-11-30 ENCOUNTER — Ambulatory Visit
Admission: RE | Admit: 2020-11-30 | Discharge: 2020-11-30 | Disposition: A | Discharge status: Home / Self Care | Payer: BC Managed Care – PPO | Source: Ambulatory Visit | Attending: Emergency Medicine | Admitting: Emergency Medicine

## 2020-11-30 ENCOUNTER — Other Ambulatory Visit: Payer: Self-pay

## 2020-11-30 DIAGNOSIS — Z0000 Encounter for general adult medical examination without abnormal findings: Secondary | ICD-10-CM

## 2020-11-30 DIAGNOSIS — Z1231 Encounter for screening mammogram for malignant neoplasm of breast: Secondary | ICD-10-CM | POA: Diagnosis not present

## 2021-08-22 ENCOUNTER — Other Ambulatory Visit: Payer: Self-pay

## 2021-08-22 ENCOUNTER — Ambulatory Visit (INDEPENDENT_AMBULATORY_CARE_PROVIDER_SITE_OTHER): Payer: BC Managed Care – PPO | Admitting: Emergency Medicine

## 2021-08-22 ENCOUNTER — Encounter: Payer: Self-pay | Admitting: Emergency Medicine

## 2021-08-22 VITALS — BP 110/60 | HR 66 | Temp 98.4°F | Ht 65.0 in | Wt 117.0 lb

## 2021-08-22 DIAGNOSIS — Z130 Encounter for screening for diseases of the blood and blood-forming organs and certain disorders involving the immune mechanism: Secondary | ICD-10-CM | POA: Diagnosis not present

## 2021-08-22 DIAGNOSIS — Z1159 Encounter for screening for other viral diseases: Secondary | ICD-10-CM | POA: Diagnosis not present

## 2021-08-22 DIAGNOSIS — Z1211 Encounter for screening for malignant neoplasm of colon: Secondary | ICD-10-CM

## 2021-08-22 DIAGNOSIS — Z1329 Encounter for screening for other suspected endocrine disorder: Secondary | ICD-10-CM | POA: Diagnosis not present

## 2021-08-22 DIAGNOSIS — Z0000 Encounter for general adult medical examination without abnormal findings: Secondary | ICD-10-CM

## 2021-08-22 DIAGNOSIS — Z13220 Encounter for screening for lipoid disorders: Secondary | ICD-10-CM

## 2021-08-22 DIAGNOSIS — Z13228 Encounter for screening for other metabolic disorders: Secondary | ICD-10-CM

## 2021-08-22 LAB — CBC WITH DIFFERENTIAL/PLATELET
Basophils Absolute: 0 10*3/uL (ref 0.0–0.1)
Basophils Relative: 0.6 % (ref 0.0–3.0)
Eosinophils Absolute: 0 10*3/uL (ref 0.0–0.7)
Eosinophils Relative: 0.9 % (ref 0.0–5.0)
HCT: 38.8 % (ref 36.0–46.0)
Hemoglobin: 13 g/dL (ref 12.0–15.0)
Lymphocytes Relative: 24.4 % (ref 12.0–46.0)
Lymphs Abs: 0.9 10*3/uL (ref 0.7–4.0)
MCHC: 33.4 g/dL (ref 30.0–36.0)
MCV: 91 fl (ref 78.0–100.0)
Monocytes Absolute: 0.2 10*3/uL (ref 0.1–1.0)
Monocytes Relative: 6.9 % (ref 3.0–12.0)
Neutro Abs: 2.4 10*3/uL (ref 1.4–7.7)
Neutrophils Relative %: 67.2 % (ref 43.0–77.0)
Platelets: 193 10*3/uL (ref 150.0–400.0)
RBC: 4.26 Mil/uL (ref 3.87–5.11)
RDW: 13.3 % (ref 11.5–15.5)
WBC: 3.5 10*3/uL — ABNORMAL LOW (ref 4.0–10.5)

## 2021-08-22 LAB — COMPREHENSIVE METABOLIC PANEL
ALT: 32 U/L (ref 0–35)
AST: 38 U/L — ABNORMAL HIGH (ref 0–37)
Albumin: 4.2 g/dL (ref 3.5–5.2)
Alkaline Phosphatase: 95 U/L (ref 39–117)
BUN: 13 mg/dL (ref 6–23)
CO2: 27 mEq/L (ref 19–32)
Calcium: 9.4 mg/dL (ref 8.4–10.5)
Chloride: 104 mEq/L (ref 96–112)
Creatinine, Ser: 0.7 mg/dL (ref 0.40–1.20)
GFR: 98.15 mL/min (ref >60.00)
Glucose, Bld: 96 mg/dL (ref 70–99)
Potassium: 4.1 mEq/L (ref 3.5–5.1)
Sodium: 141 mEq/L (ref 135–145)
Total Bilirubin: 1.5 mg/dL — ABNORMAL HIGH (ref 0.2–1.2)
Total Protein: 6.9 g/dL (ref 6.0–8.3)

## 2021-08-22 LAB — HEMOGLOBIN A1C: Hgb A1c MFr Bld: 5.5 % (ref 4.6–6.5)

## 2021-08-22 LAB — LIPID PANEL
Cholesterol: 170 mg/dL (ref 0–200)
HDL: 78.1 mg/dL (ref >39.00)
LDL Cholesterol: 79 mg/dL (ref 0–99)
NonHDL: 92.35
Total CHOL/HDL Ratio: 2
Triglycerides: 68 mg/dL (ref 0.0–149.0)
VLDL: 13.6 mg/dL (ref 0.0–40.0)

## 2021-08-22 LAB — TSH: TSH: 0.85 u[IU]/mL (ref 0.35–5.50)

## 2021-08-22 NOTE — Patient Instructions (Signed)

## 2021-08-22 NOTE — Progress Notes (Signed)
Carolyn Woodward 55 y.o.   Chief Complaint  Patient presents with   Annual Exam    HISTORY OF PRESENT ILLNESS: This is a 55 y.o. female here for annual exam Healthy female with a healthy lifestyle. Long-distance runner.  Very good nutrition. Non-smoker. No chronic medical problems.  No chronic medications. Up-to-date with gynecological regular evaluations. Up-to-date with mammogram. Last GYN office visit and notes as follows: ASSESSMENT:  55 y.o. G2P1011 here for a breast and pelvic exam   PLAN:    1. Postmenopausal.  Vaginal dryness is her main concern.  We discussed and reviewed use of lubricants silicone or water-based for improved comfort with sexual activity, and a vaginal moisturizer such as Replens for moisture maintenance.  Also reviewed possibility of restarting vaginal estrogen cream whether compounded or scription for manufactured creams.  She would like to just try lubricants for now and will let us know if she needs anything additional for symptom control. 2. Pap smear/HPV 2019.  No significant history of abnormal Pap smears.  Next Pap smear due 2024 following the current guidelines recommending the 5 year interval. 3. Mammogram 09/2019.  Normal breast exam today.  She is reminded to schedule an annual mammogram when due. 4. Colonoscopy never.  We discussed the importance of colon cancer screening.  She says she does have a referral from her primary doctor and she just needs to go ahead and schedule the colonoscopy which she plans to do soon. 5. DEXA never.  Would recommend when further into menopause around age 44 6. Health maintenance.  No labs today as she normally has these completed elsewhere.   Return annually or sooner, prn.   Carolyn Majors MD 09/07/20   HPI   Prior to Admission medications   Medication Sig Start Date End Date Taking? Authorizing Provider  IRON PO Take by mouth.   Yes [provider]  Multiple Vitamin (MULTIVITAMIN) capsule Take 1  capsule by mouth daily.   Yes [provider]  Probiotic Product (PROBIOTIC PO) Take by mouth.   Yes [provider]    No Known Allergies  There are no problems to display for this patient.   No past medical history on file.  No past surgical history on file.  Social History   Socioeconomic History   Marital status: Married    Spouse name: Not on file   Number of children: Not on file   Years of education: Not on file   Highest education level: Not on file  Occupational History   Occupation: Location manager  Tobacco Use   Smoking status: Never   Smokeless tobacco: Never  Vaping Use   Vaping Use: Never used  Substance and Sexual Activity   Alcohol use: Yes    Alcohol/week: 0.0 standard drinks    Comment: rare    Drug use: No   Sexual activity: Yes    Birth control/protection: Post-menopausal    Comment: 1st intercourse 55 yo-Fewer than 5 partners  Other Topics Concern   Not on file  Social History Narrative   Not on file   Social Determinants of Health   Financial Resource Strain: Not on file  Food Insecurity: Not on file  Transportation Needs: Not on file  Physical Activity: Not on file  Stress: Not on file  Social Connections: Not on file  Intimate Partner Violence: Not on file    Family History  Problem Relation Age of Onset   Heart attack Maternal Grandfather    Breast cancer  Neg Hx      Review of Systems  Constitutional: Negative.  Negative for chills and fever.  HENT: Negative.  Negative for congestion and sore throat.   Eyes: Negative.   Respiratory: Negative.  Negative for cough and shortness of breath.   Cardiovascular: Negative.  Negative for chest pain and palpitations.  Gastrointestinal: Negative.  Negative for abdominal pain, blood in stool, diarrhea, melena, nausea and vomiting.  Genitourinary: Negative.  Negative for dysuria and hematuria.  Musculoskeletal: Negative.  Negative for back pain, joint pain and myalgias.   Skin: Negative.  Negative for rash.  Neurological: Negative.  Negative for dizziness and headaches.  All other systems reviewed and are negative.   Physical Exam Vitals reviewed.  Constitutional:      Appearance: Normal appearance.  HENT:     Head: Normocephalic.     Right Ear: Tympanic membrane, ear canal and external ear normal.     Left Ear: Tympanic membrane, ear canal and external ear normal.     Mouth/Throat:     Mouth: Mucous membranes are moist.     Pharynx: Oropharynx is clear.  Eyes:     Extraocular Movements: Extraocular movements intact.     Conjunctiva/sclera: Conjunctivae normal.     Pupils: Pupils are equal, round, and reactive to light.  Neck:     Vascular: No carotid bruit.  Cardiovascular:     Rate and Rhythm: Normal rate and regular rhythm.     Pulses: Normal pulses.     Heart sounds: Normal heart sounds.  Pulmonary:     Effort: Pulmonary effort is normal.     Breath sounds: Normal breath sounds.  Abdominal:     General: Bowel sounds are normal. There is no distension.     Palpations: Abdomen is soft.     Tenderness: There is no abdominal tenderness.  Musculoskeletal:        General: Normal range of motion.     Cervical back: Normal range of motion and neck supple. No tenderness.     Right lower leg: No edema.     Left lower leg: No edema.  Lymphadenopathy:     Cervical: No cervical adenopathy.  Skin:    General: Skin is warm and dry.     Capillary Refill: Capillary refill takes less than 2 seconds.  Neurological:     General: No focal deficit present.     Mental Status: She is alert and oriented to person, place, and time.  Psychiatric:        Mood and Affect: Mood normal.        Behavior: Behavior normal.     ASSESSMENT & PLAN: Problem List Items Addressed This Visit   None Visit Diagnoses     Routine general medical examination at a health care facility    -  Primary   Need for hepatitis C screening test       Relevant Orders    Hepatitis C antibody screen   Colon cancer screening       Relevant Orders   Ambulatory referral to Gastroenterology   Screening for deficiency anemia       Relevant Orders   CBC with Differential   Screening for lipoid disorders       Relevant Orders   Lipid panel   Screening for endocrine, metabolic and immunity disorder       Relevant Orders   Comprehensive metabolic panel   Hemoglobin A1c   TSH      Modifiable risk  factors discussed with patient. Anticipatory guidance according to age provided. The following topics were also discussed: Social Determinants of Health Smoking.  Non-smoker Diet and nutrition Benefits of exercise Cancer screening and need for colon cancer screening with colonoscopy Vaccinations recommendations.  Up-to-date. Cardiovascular risk assessment.  Lipid profile done today Mental health including depression and anxiety Fall and accident prevention  Patient Instructions  Health Maintenance, Female Adopting a healthy lifestyle and getting preventive care are important in promoting health and wellness. Ask your health care provider about: The right schedule for you to have regular tests and exams. Things you can do on your own to prevent diseases and keep yourself healthy. What should I know about diet, weight, and exercise? Eat a healthy diet  Eat a diet that includes plenty of vegetables, fruits, low-fat dairy products, and lean protein. Do not eat a lot of foods that are high in solid fats, added sugars, or sodium. Maintain a healthy weight Body mass index (BMI) is used to identify weight problems. It estimates body fat based on height and weight. Your health care provider can help determine your BMI and help you achieve or maintain a healthy weight. Get regular exercise Get regular exercise. This is one of the most important things you can do for your health. Most adults should: Exercise for at least 150 minutes each week. The exercise should  increase your heart rate and make you sweat (moderate-intensity exercise). Do strengthening exercises at least twice a week. This is in addition to the moderate-intensity exercise. Spend less time sitting. Even light physical activity can be beneficial. Watch cholesterol and blood lipids Have your blood tested for lipids and cholesterol at 54 years of age, then have this test every 5 years. Have your cholesterol levels checked more often if: Your lipid or cholesterol levels are high. You are older than 54 years of age. You are at high risk for heart disease. What should I know about cancer screening? Depending on your health history and family history, you may need to have cancer screening at various ages. This may include screening for: Breast cancer. Cervical cancer. Colorectal cancer. Skin cancer. Lung cancer. What should I know about heart disease, diabetes, and high blood pressure? Blood pressure and heart disease High blood pressure causes heart disease and increases the risk of stroke. This is more likely to develop in people who have high blood pressure readings or are overweight. Have your blood pressure checked: Every 3-5 years if you are 51-28 years of age. Every year if you are 35 years old or older. Diabetes Have regular diabetes screenings. This checks your fasting blood sugar level. Have the screening done: Once every three years after age 58 if you are at a normal weight and have a low risk for diabetes. More often and at a younger age if you are overweight or have a high risk for diabetes. What should I know about preventing infection? Hepatitis B If you have a higher risk for hepatitis B, you should be screened for this virus. Talk with your health care provider to find out if you are at risk for hepatitis B infection. Hepatitis C Testing is recommended for: Everyone born from [birth year] through 1964. Anyone with known risk factors for hepatitis C. Sexually transmitted  infections (STIs) Get screened for STIs, including gonorrhea and chlamydia, if: You are sexually active and are younger than 54 years of age. You are older than 54 years of age and your health care provider tells you that  you are at risk for this type of infection. Your sexual activity has changed since you were last screened, and you are at increased risk for chlamydia or gonorrhea. Ask your health care provider if you are at risk. Ask your health care provider about whether you are at high risk for HIV. Your health care provider may recommend a prescription medicine to help prevent HIV infection. If you choose to take medicine to prevent HIV, you should first get tested for HIV. You should then be tested every 3 months for as long as you are taking the medicine. Pregnancy If you are about to stop having your period (premenopausal) and you may become pregnant, seek counseling before you get pregnant. Take 400 to 800 micrograms (mcg) of folic acid every day if you become pregnant. Ask for birth control (contraception) if you want to prevent pregnancy. Osteoporosis and menopause Osteoporosis is a disease in which the bones lose minerals and strength with aging. This can result in bone fractures. If you are 31 years old or older, or if you are at risk for osteoporosis and fractures, ask your health care provider if you should: Be screened for bone loss. Take a calcium or vitamin D supplement to lower your risk of fractures. Be given hormone replacement therapy (HRT) to treat symptoms of menopause. Follow these instructions at home: Alcohol use Do not drink alcohol if: Your health care provider tells you not to drink. You are pregnant, may be pregnant, or are planning to become pregnant. If you drink alcohol: Limit how much you have to: 0-1 drink a day. Know how much alcohol is in your drink. In the [place] one drink equals one 12 oz bottle of beer (355 mL), one 5 oz glass of wine (148 mL), or one  1 oz glass of hard liquor (44 mL). Lifestyle Do not use any products that contain nicotine or tobacco. These products include cigarettes, chewing tobacco, and vaping devices, such as e-cigarettes. If you need help quitting, ask your health care provider. Do not use street drugs. Do not share needles. Ask your health care provider for help if you need support or information about quitting drugs. General instructions Schedule regular health, dental, and eye exams. Stay current with your vaccines. Tell your health care provider if: You often feel depressed. You have ever been abused or do not feel safe at home. Summary Adopting a healthy lifestyle and getting preventive care are important in promoting health and wellness. Follow your health care provider's instructions about healthy diet, exercising, and getting tested or screened for diseases. Follow your health care provider's instructions on monitoring your cholesterol and blood pressure. This information is not intended to replace advice given to you by your health care provider. Make sure you discuss any questions you have with your health care provider. Document Revised: 01/24/2021 Document Reviewed: 01/24/2021 Elsevier Patient Education  2022 [place], [place] [facility]

## 2021-08-23 LAB — HEPATITIS C ANTIBODY
Hepatitis C Ab: NONREACTIVE
SIGNAL TO CUT-OFF: 0.04 (ref <1.00)

## 2021-09-08 ENCOUNTER — Encounter: Payer: BC Managed Care – PPO | Admitting: Obstetrics and Gynecology

## 2021-10-27 ENCOUNTER — Other Ambulatory Visit: Payer: Self-pay | Admitting: Emergency Medicine

## 2021-10-27 DIAGNOSIS — Z1231 Encounter for screening mammogram for malignant neoplasm of breast: Secondary | ICD-10-CM

## 2021-12-01 ENCOUNTER — Ambulatory Visit
Admission: RE | Admit: 2021-12-01 | Discharge: 2021-12-01 | Disposition: A | Discharge status: Home / Self Care | Payer: BC Managed Care – PPO | Source: Ambulatory Visit | Attending: Emergency Medicine | Admitting: Emergency Medicine

## 2021-12-01 DIAGNOSIS — Z1231 Encounter for screening mammogram for malignant neoplasm of breast: Secondary | ICD-10-CM | POA: Diagnosis not present

## 2022-08-22 ENCOUNTER — Telehealth: Payer: Self-pay | Admitting: Emergency Medicine

## 2022-08-22 ENCOUNTER — Other Ambulatory Visit: Payer: Self-pay | Admitting: Emergency Medicine

## 2022-08-22 DIAGNOSIS — Z1231 Encounter for screening mammogram for malignant neoplasm of breast: Secondary | ICD-10-CM

## 2022-08-28 ENCOUNTER — Encounter: Payer: Self-pay | Admitting: Emergency Medicine

## 2022-08-28 ENCOUNTER — Ambulatory Visit (INDEPENDENT_AMBULATORY_CARE_PROVIDER_SITE_OTHER): Payer: BC Managed Care – PPO | Admitting: Emergency Medicine

## 2022-08-28 VITALS — BP 124/66 | HR 68 | Temp 98.1°F | Ht 65.0 in | Wt 115.0 lb

## 2022-08-28 DIAGNOSIS — Z1329 Encounter for screening for other suspected endocrine disorder: Secondary | ICD-10-CM

## 2022-08-28 DIAGNOSIS — Z0000 Encounter for general adult medical examination without abnormal findings: Secondary | ICD-10-CM | POA: Diagnosis not present

## 2022-08-28 DIAGNOSIS — Z130 Encounter for screening for diseases of the blood and blood-forming organs and certain disorders involving the immune mechanism: Secondary | ICD-10-CM | POA: Diagnosis not present

## 2022-08-28 DIAGNOSIS — Z13220 Encounter for screening for lipoid disorders: Secondary | ICD-10-CM | POA: Diagnosis not present

## 2022-08-28 DIAGNOSIS — Z1211 Encounter for screening for malignant neoplasm of colon: Secondary | ICD-10-CM | POA: Diagnosis not present

## 2022-08-28 DIAGNOSIS — Z13228 Encounter for screening for other metabolic disorders: Secondary | ICD-10-CM

## 2022-08-28 LAB — CBC WITH DIFFERENTIAL/PLATELET
Basophils Absolute: 0 10*3/uL (ref 0.0–0.1)
Basophils Relative: 0.6 % (ref 0.0–3.0)
Eosinophils Absolute: 0 10*3/uL (ref 0.0–0.7)
Eosinophils Relative: 0.3 % (ref 0.0–5.0)
HCT: 40.9 % (ref 36.0–46.0)
Hemoglobin: 13.8 g/dL (ref 12.0–15.0)
Lymphocytes Relative: 33.1 % (ref 12.0–46.0)
Lymphs Abs: 1.2 10*3/uL (ref 0.7–4.0)
MCHC: 33.6 g/dL (ref 30.0–36.0)
MCV: 90.2 fl (ref 78.0–100.0)
Monocytes Absolute: 0.2 10*3/uL (ref 0.1–1.0)
Monocytes Relative: 6.9 % (ref 3.0–12.0)
Neutro Abs: 2.1 10*3/uL (ref 1.4–7.7)
Neutrophils Relative %: 59.1 % (ref 43.0–77.0)
Platelets: 218 10*3/uL (ref 150.0–400.0)
RBC: 4.54 Mil/uL (ref 3.87–5.11)
RDW: 14.3 % (ref 11.5–15.5)
WBC: 3.6 10*3/uL — ABNORMAL LOW (ref 4.0–10.5)

## 2022-08-28 LAB — COMPREHENSIVE METABOLIC PANEL
ALT: 22 U/L (ref 0–35)
AST: 33 U/L (ref 0–37)
Albumin: 4.4 g/dL (ref 3.5–5.2)
Alkaline Phosphatase: 88 U/L (ref 39–117)
BUN: 13 mg/dL (ref 6–23)
CO2: 29 mEq/L (ref 19–32)
Calcium: 9.5 mg/dL (ref 8.4–10.5)
Chloride: 103 mEq/L (ref 96–112)
Creatinine, Ser: 0.65 mg/dL (ref 0.40–1.20)
GFR: 99.21 mL/min (ref >60.00)
Glucose, Bld: 99 mg/dL (ref 70–99)
Potassium: 4 mEq/L (ref 3.5–5.1)
Sodium: 140 mEq/L (ref 135–145)
Total Bilirubin: 1.3 mg/dL — ABNORMAL HIGH (ref 0.2–1.2)
Total Protein: 7.1 g/dL (ref 6.0–8.3)

## 2022-08-28 LAB — LIPID PANEL
Cholesterol: 182 mg/dL (ref 0–200)
HDL: 92.1 mg/dL (ref >39.00)
LDL Cholesterol: 78 mg/dL (ref 0–99)
NonHDL: 89.55
Total CHOL/HDL Ratio: 2
Triglycerides: 57 mg/dL (ref 0.0–149.0)
VLDL: 11.4 mg/dL (ref 0.0–40.0)

## 2022-08-28 LAB — HEMOGLOBIN A1C: Hgb A1c MFr Bld: 5.6 % (ref 4.6–6.5)

## 2022-08-28 NOTE — Progress Notes (Signed)
Carolyn Woodward 56 y.o.   Chief Complaint  Patient presents with   Physical    No concerns or questions    HISTORY OF PRESENT ILLNESS: This is a 56 y.o. female here for annual exam. Healthy female with a healthy lifestyle Eats well and exercises regularly.  Long-distance runner Has no complaints or medical concerns today.  HPI   Prior to Admission medications   Medication Sig Start Date End Date Taking? Authorizing Provider  Cholecalciferol (D3 2000 PO) Take by mouth.   Yes [provider]  IRON PO Take by mouth.   Yes [provider]  Multiple Vitamin (MULTIVITAMIN) capsule Take 1 capsule by mouth daily.   Yes [provider]  Probiotic Product (PROBIOTIC PO) Take by mouth.   Yes [provider]    No Known Allergies  There are no problems to display for this patient.   No past medical history on file.  No past surgical history on file.  Social History   Socioeconomic History   Marital status: Married    Spouse name: Not on file   Number of children: Not on file   Years of education: Not on file   Highest education level: Not on file  Occupational History   Occupation: Location manager  Tobacco Use   Smoking status: Never   Smokeless tobacco: Never  Vaping Use   Vaping Use: Never used  Substance and Sexual Activity   Alcohol use: Yes    Alcohol/week: 0.0 standard drinks of alcohol    Comment: rare    Drug use: No   Sexual activity: Yes    Birth control/protection: Post-menopausal    Comment: 1st intercourse 56 yo-Fewer than 5 partners  Other Topics Concern   Not on file  Social History Narrative   Not on file   Social Determinants of Health   Financial Resource Strain: Not on file  Food Insecurity: Not on file  Transportation Needs: Not on file  Physical Activity: Not on file  Stress: Not on file  Social Connections: Not on file  Intimate Partner Violence: Not on file    Family History  Problem Relation Age  of Onset   Heart attack Maternal Grandfather    Breast cancer Neg Hx      Review of Systems  Constitutional: Negative.  Negative for chills and fever.  HENT: Negative.  Negative for congestion and sore throat.   Respiratory: Negative.  Negative for cough and shortness of breath.   Cardiovascular: Negative.  Negative for chest pain and palpitations.  Gastrointestinal: Negative.  Negative for abdominal pain, diarrhea, nausea and vomiting.  Genitourinary: Negative.  Negative for dysuria and hematuria.  Skin: Negative.  Negative for rash.  Neurological: Negative.  Negative for dizziness and headaches.  All other systems reviewed and are negative.  Today's Vitals   08/28/22 0907  BP: 124/66  Pulse: 68  Temp: 98.1 F (36.7 C)  TempSrc: Oral  SpO2: 98%  Weight: 115 lb (52.2 kg)  Height: 5\' 5"  (1.651 m)   Body mass index is 19.14 kg/m.   Physical Exam Vitals reviewed.  Constitutional:      Appearance: Normal appearance.  HENT:     Head: Normocephalic.     Right Ear: Tympanic membrane, ear canal and external ear normal.     Left Ear: Tympanic membrane, ear canal and external ear normal.     Mouth/Throat:     Mouth: Mucous membranes are moist.     Pharynx: Oropharynx is  clear.  Eyes:     Extraocular Movements: Extraocular movements intact.     Conjunctiva/sclera: Conjunctivae normal.     Pupils: Pupils are equal, round, and reactive to light.  Cardiovascular:     Rate and Rhythm: Normal rate and regular rhythm.     Pulses: Normal pulses.     Heart sounds: Normal heart sounds.  Pulmonary:     Effort: Pulmonary effort is normal.     Breath sounds: Normal breath sounds.  Abdominal:     Palpations: Abdomen is soft.     Tenderness: There is no abdominal tenderness.  Musculoskeletal:     Cervical back: No tenderness.  Lymphadenopathy:     Cervical: No cervical adenopathy.  Skin:    General: Skin is warm and dry.  Neurological:     General: No focal deficit present.      Mental Status: She is alert and oriented to person, place, and time.  Psychiatric:        Mood and Affect: Mood normal.        Behavior: Behavior normal.      ASSESSMENT & PLAN: Problem List Items Addressed This Visit   None Visit Diagnoses     Routine general medical examination at a health care facility    -  Primary   Relevant Orders   CBC with Differential   Comprehensive metabolic panel   Hemoglobin A1c   Lipid panel   Colon cancer screening       Relevant Orders   Ambulatory referral to Gastroenterology   Screening for deficiency anemia       Relevant Orders   CBC with Differential   Screening for lipoid disorders       Relevant Orders   Lipid panel   Screening for endocrine, metabolic and immunity disorder       Relevant Orders   Comprehensive metabolic panel   Hemoglobin A1c      Modifiable risk factors discussed with patient. Anticipatory guidance according to age provided. The following topics were also discussed: Social Determinants of Health Smoking.  Non-smoker Diet and nutrition.  Good eating habits Benefits of exercise.  Exercises regularly Cancer screening and need for colon cancer screening with colonoscopy.  Up-to-date with mammograms Vaccinations reviewed and recommendations Cardiovascular risk assessment The 10-year ASCVD risk score (Arnett DK, et al., 2019) is: 1.1%   Values used to calculate the score:     Age: [age] years     Sex: Female     Is Non-Hispanic African American: No     Diabetic: No     Tobacco smoker: No     Systolic Blood Pressure: 124 mmHg     Is BP treated: No     HDL Cholesterol: 78.1 mg/dL     Total Cholesterol: 170 mg/dL  Mental health including depression and anxiety Fall and accident prevention  Patient Instructions  Health Maintenance, Female Adopting a healthy lifestyle and getting preventive care are important in promoting health and wellness. Ask your health care provider about: The right schedule for you to  have regular tests and exams. Things you can do on your own to prevent diseases and keep yourself healthy. What should I know about diet, weight, and exercise? Eat a healthy diet  Eat a diet that includes plenty of vegetables, fruits, low-fat dairy products, and lean protein. Do not eat a lot of foods that are high in solid fats, added sugars, or sodium. Maintain a healthy weight Body mass index (BMI) is used  to identify weight problems. It estimates body fat based on height and weight. Your health care provider can help determine your BMI and help you achieve or maintain a healthy weight. Get regular exercise Get regular exercise. This is one of the most important things you can do for your health. Most adults should: Exercise for at least 150 minutes each week. The exercise should increase your heart rate and make you sweat (moderate-intensity exercise). Do strengthening exercises at least twice a week. This is in addition to the moderate-intensity exercise. Spend less time sitting. Even light physical activity can be beneficial. Watch cholesterol and blood lipids Have your blood tested for lipids and cholesterol at 55 years of age, then have this test every 5 years. Have your cholesterol levels checked more often if: Your lipid or cholesterol levels are high. You are older than 55 years of age. You are at high risk for heart disease. What should I know about cancer screening? Depending on your health history and family history, you may need to have cancer screening at various ages. This may include screening for: Breast cancer. Cervical cancer. Colorectal cancer. Skin cancer. Lung cancer. What should I know about heart disease, diabetes, and high blood pressure? Blood pressure and heart disease High blood pressure causes heart disease and increases the risk of stroke. This is more likely to develop in people who have high blood pressure readings or are overweight. Have your blood  pressure checked: Every 3-5 years if you are 11-51 years of age. Every year if you are 59 years old or older. Diabetes Have regular diabetes screenings. This checks your fasting blood sugar level. Have the screening done: Once every three years after age 7 if you are at a normal weight and have a low risk for diabetes. More often and at a younger age if you are overweight or have a high risk for diabetes. What should I know about preventing infection? Hepatitis B If you have a higher risk for hepatitis B, you should be screened for this virus. Talk with your health care provider to find out if you are at risk for hepatitis B infection. Hepatitis C Testing is recommended for: Everyone born from [birth year] through 1964. Anyone with known risk factors for hepatitis C. Sexually transmitted infections (STIs) Get screened for STIs, including gonorrhea and chlamydia, if: You are sexually active and are younger than 55 years of age. You are older than 55 years of age and your health care provider tells you that you are at risk for this type of infection. Your sexual activity has changed since you were last screened, and you are at increased risk for chlamydia or gonorrhea. Ask your health care provider if you are at risk. Ask your health care provider about whether you are at high risk for HIV. Your health care provider may recommend a prescription medicine to help prevent HIV infection. If you choose to take medicine to prevent HIV, you should first get tested for HIV. You should then be tested every 3 months for as long as you are taking the medicine. Pregnancy If you are about to stop having your period (premenopausal) and you may become pregnant, seek counseling before you get pregnant. Take 400 to 800 micrograms (mcg) of folic acid every day if you become pregnant. Ask for birth control (contraception) if you want to prevent pregnancy. Osteoporosis and menopause Osteoporosis is a disease in which  the bones lose minerals and strength with aging. This can result in bone  fractures. If you are 52 years old or older, or if you are at risk for osteoporosis and fractures, ask your health care provider if you should: Be screened for bone loss. Take a calcium or vitamin D supplement to lower your risk of fractures. Be given hormone replacement therapy (HRT) to treat symptoms of menopause. Follow these instructions at home: Alcohol use Do not drink alcohol if: Your health care provider tells you not to drink. You are pregnant, may be pregnant, or are planning to become pregnant. If you drink alcohol: Limit how much you have to: 0-1 drink a day. Know how much alcohol is in your drink. In the [place] one drink equals one 12 oz bottle of beer (355 mL), one 5 oz glass of wine (148 mL), or one 1 oz glass of hard liquor (44 mL). Lifestyle Do not use any products that contain nicotine or tobacco. These products include cigarettes, chewing tobacco, and vaping devices, such as e-cigarettes. If you need help quitting, ask your health care provider. Do not use street drugs. Do not share needles. Ask your health care provider for help if you need support or information about quitting drugs. General instructions Schedule regular health, dental, and eye exams. Stay current with your vaccines. Tell your health care provider if: You often feel depressed. You have ever been abused or do not feel safe at home. Summary Adopting a healthy lifestyle and getting preventive care are important in promoting health and wellness. Follow your health care provider's instructions about healthy diet, exercising, and getting tested or screened for diseases. Follow your health care provider's instructions on monitoring your cholesterol and blood pressure. This information is not intended to replace advice given to you by your health care provider. Make sure you discuss any questions you have with your health care  provider. Document Revised: 01/24/2021 Document Reviewed: 01/24/2021 Elsevier Patient Education  2023 Elsevier Inc.     Edwina Barth, MD [facility]

## 2022-08-28 NOTE — Patient Instructions (Signed)

## 2022-10-04 ENCOUNTER — Encounter: Payer: Self-pay | Admitting: Obstetrics & Gynecology

## 2022-10-04 ENCOUNTER — Other Ambulatory Visit (HOSPITAL_COMMUNITY)
Admission: RE | Admit: 2022-10-04 | Discharge: 2022-10-04 | Disposition: A | Discharge status: Home / Self Care | Payer: BC Managed Care – PPO | Source: Ambulatory Visit | Attending: Obstetrics & Gynecology | Admitting: Obstetrics & Gynecology

## 2022-10-04 ENCOUNTER — Ambulatory Visit (INDEPENDENT_AMBULATORY_CARE_PROVIDER_SITE_OTHER): Payer: BC Managed Care – PPO | Admitting: Obstetrics & Gynecology

## 2022-10-04 VITALS — BP 118/72 | HR 70 | Ht 63.5 in | Wt 116.0 lb

## 2022-10-04 DIAGNOSIS — Z01419 Encounter for gynecological examination (general) (routine) without abnormal findings: Secondary | ICD-10-CM

## 2022-10-04 DIAGNOSIS — Z780 Asymptomatic menopausal state: Secondary | ICD-10-CM | POA: Diagnosis not present

## 2022-10-04 NOTE — Progress Notes (Signed)
Carolyn Woodward Sep 27, 1965 [id number]   History:    57 y.o. G2P1A1L1 Married  RP:  Established patient for Annual Gyn exam  HPI:  Postmenopause, well on no HRT.  No PMB.  No pelvic pain.  Sexually active, using baby oil, can also use coconut oil.  No h/o abnormal Pap.  Last Pap Neg 09/2018.  Pap reflex today.  Breasts normal.  Mammo Neg 11/2021.  Bone Density normal in 2022.  Will schedule Colono now.  Health labs normal with Fam MD.  BMI 20.23.  Runs and swims.  Good nutrition.  Flu vaccine received at work.   Past medical history,surgical history, family history and social history were all reviewed and documented in the EPIC chart.  Gynecologic History Patient's last menstrual period was 07/14/2016.  Obstetric History OB History  Gravida Para Term Preterm AB Living  2 1 1   1 1   SAB IAB Ectopic Multiple Live Births  1            # Outcome Date GA Lbr Len/2nd Weight Sex Delivery Anes PTL Lv  2 SAB           1 Term              ROS: A ROS was performed and pertinent positives and negatives are included in the history. GENERAL: No fevers or chills. HEENT: No change in vision, no earache, sore throat or sinus congestion. NECK: No pain or stiffness. CARDIOVASCULAR: No chest pain or pressure. No palpitations. PULMONARY: No shortness of breath, cough or wheeze. GASTROINTESTINAL: No abdominal pain, nausea, vomiting or diarrhea, melena or bright red blood per rectum. GENITOURINARY: No urinary frequency, urgency, hesitancy or dysuria. MUSCULOSKELETAL: No joint or muscle pain, no back pain, no recent trauma. DERMATOLOGIC: No rash, no itching, no lesions. ENDOCRINE: No polyuria, polydipsia, no heat or cold intolerance. No recent change in weight. HEMATOLOGICAL: No anemia or easy bruising or bleeding. NEUROLOGIC: No headache, seizures, numbness, tingling or weakness. PSYCHIATRIC: No depression, no loss of interest in normal activity or change in sleep pattern.     Exam:   BP 118/72   Pulse 70    Ht 5' 3.5" (1.613 m)   Wt 116 lb (52.6 kg)   LMP 07/14/2016 Comment: SA  SpO2 99%   BMI 20.23 kg/m   Body mass index is 20.23 kg/m.  General appearance : Well developed well nourished female. No acute distress HEENT: Eyes: no retinal hemorrhage or exudates,  Neck supple, trachea midline, no carotid bruits, no thyroidmegaly Lungs: Clear to auscultation, no rhonchi or wheezes, or rib retractions  Heart: Regular rate and rhythm, no murmurs or gallops Breast:Examined in sitting and supine position were symmetrical in appearance, no palpable masses or tenderness,  no skin retraction, no nipple inversion, no nipple discharge, no skin discoloration, no axillary or supraclavicular lymphadenopathy Abdomen: no palpable masses or tenderness, no rebound or guarding Extremities: no edema or skin discoloration or tenderness  Pelvic: Vulva: Normal             Vagina: No gross lesions or discharge  Cervix: No gross lesions or discharge.  Pap reflex done.  Uterus  AV, normal size, shape and consistency, non-tender and mobile  Adnexa  Without masses or tenderness  Anus: Normal   Assessment/Plan:  57 y.o. female for annual exam   1. Encounter for routine gynecological examination with Papanicolaou smear of cervix Postmenopause, well on no HRT.  No PMB.  No pelvic pain.  Sexually active, using  baby oil, can also use coconut oil.  No h/o abnormal Pap.  Last Pap Neg 09/2018.  Pap reflex today.  Breasts normal.  Mammo Neg 11/2021.  Bone Density normal in 2022.  Will schedule Colono now.  Health labs normal with Fam MD.  BMI 20.23.  Runs and swims.  Good nutrition.  Flu vaccine received at work. - Cytology - PAP( [place])  2. Postmenopause  Postmenopause, well on no HRT.  No PMB.  No pelvic pain.  Sexually active, using baby oil, can also use coconut oil. Bone Density normal in 2022.   [name], 4:06 PM

## 2022-10-06 LAB — CYTOLOGY - PAP: Diagnosis: NEGATIVE

## 2022-12-04 ENCOUNTER — Ambulatory Visit
Admission: RE | Admit: 2022-12-04 | Discharge: 2022-12-04 | Disposition: A | Discharge status: Home / Self Care | Payer: BC Managed Care – PPO | Source: Ambulatory Visit | Attending: Emergency Medicine | Admitting: Emergency Medicine

## 2022-12-04 DIAGNOSIS — Z1231 Encounter for screening mammogram for malignant neoplasm of breast: Secondary | ICD-10-CM | POA: Diagnosis not present

## 2023-01-25 IMAGING — MG MM DIGITAL SCREENING BILAT W/ TOMO AND CAD
8 series · 9 of 24 positions shown · non-contrast
Comparison: Previous exam(s).

CLINICAL DATA: Screening.

EXAM:
DIGITAL SCREENING BILATERAL MAMMOGRAM WITH TOMOSYNTHESIS AND CAD
TECHNIQUE: Bilateral screening digital craniocaudal and mediolateral oblique
mammograms were obtained. Bilateral screening digital breast
tomosynthesis was performed. The images were evaluated with
computer-aided detection.

[R MLO synth-2D]
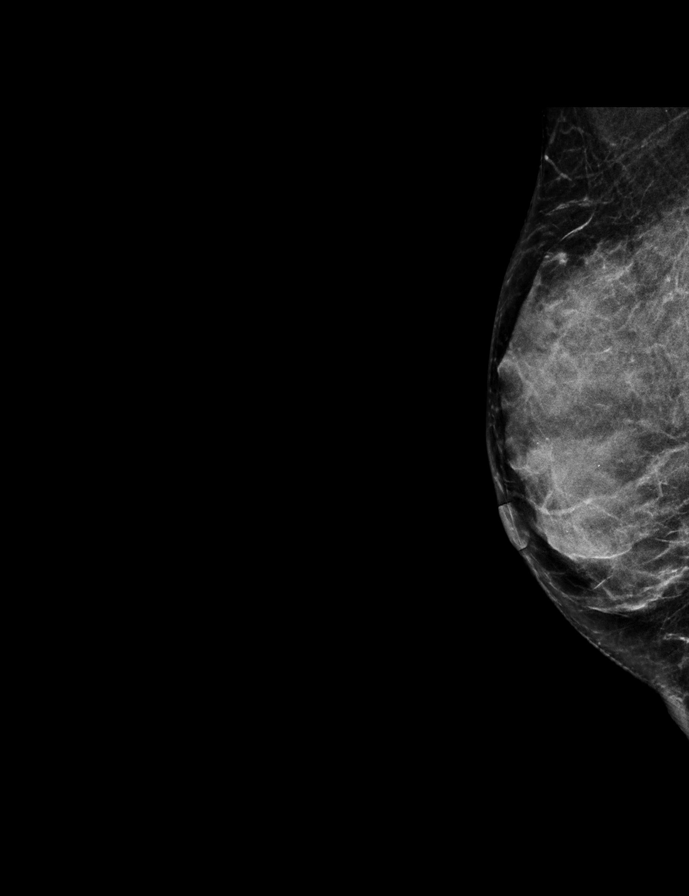

[L CC synth-2D]
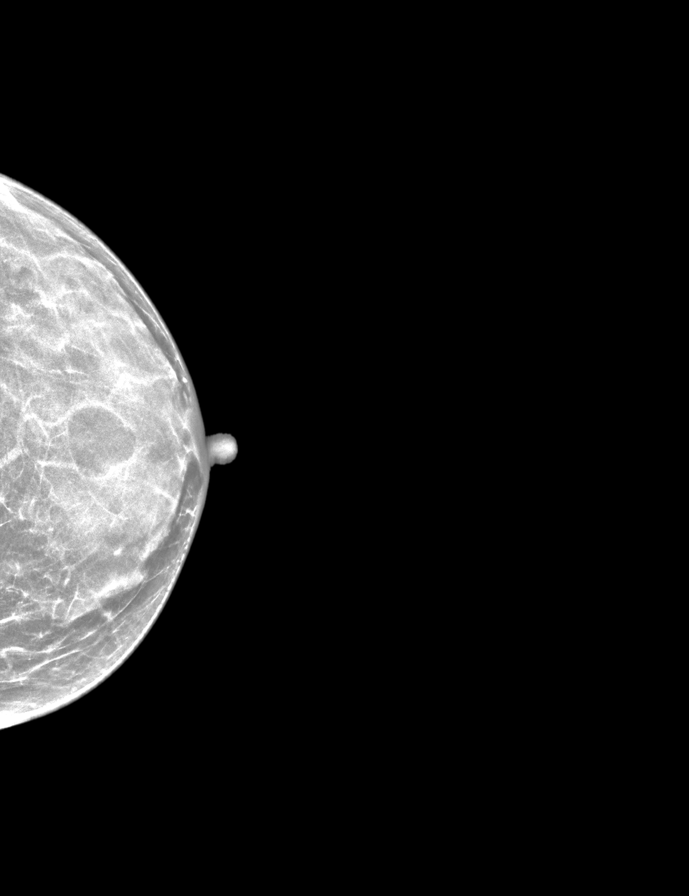

[R CC synth-2D]
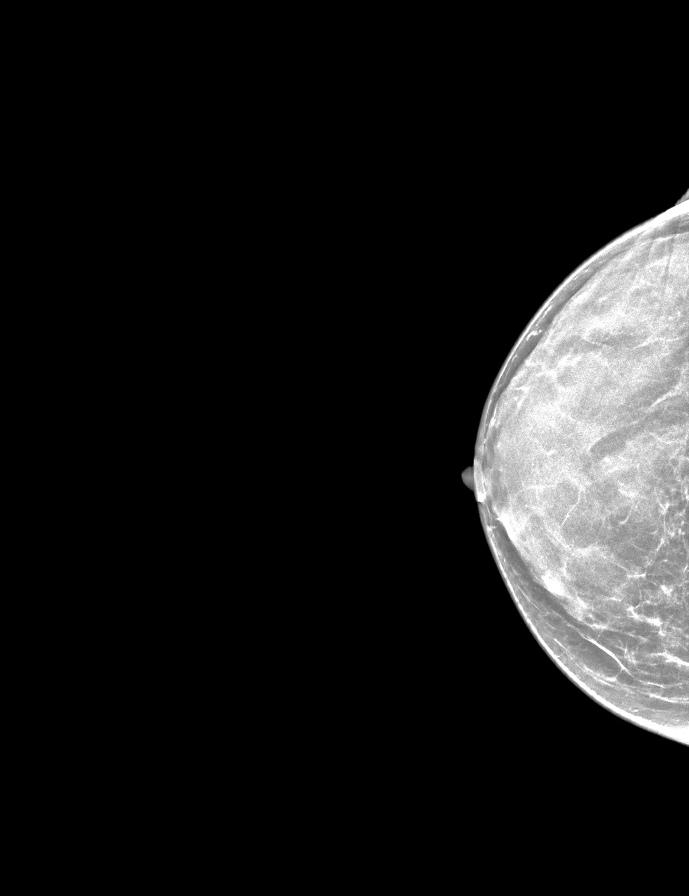

[L MLO synth-2D]
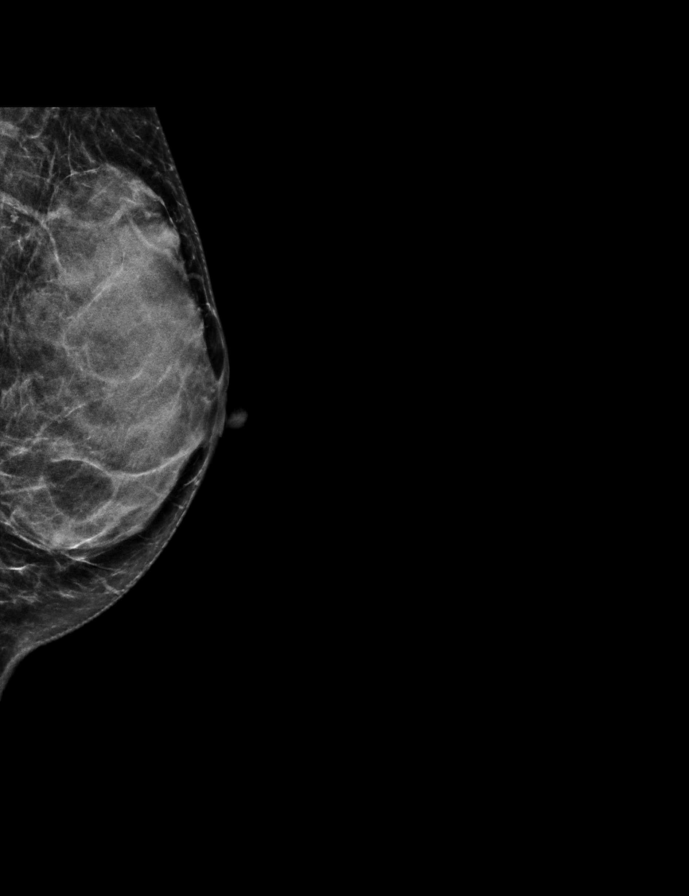

[L CC tomo · 2 of 42 frames shown]
[frame 14/42]
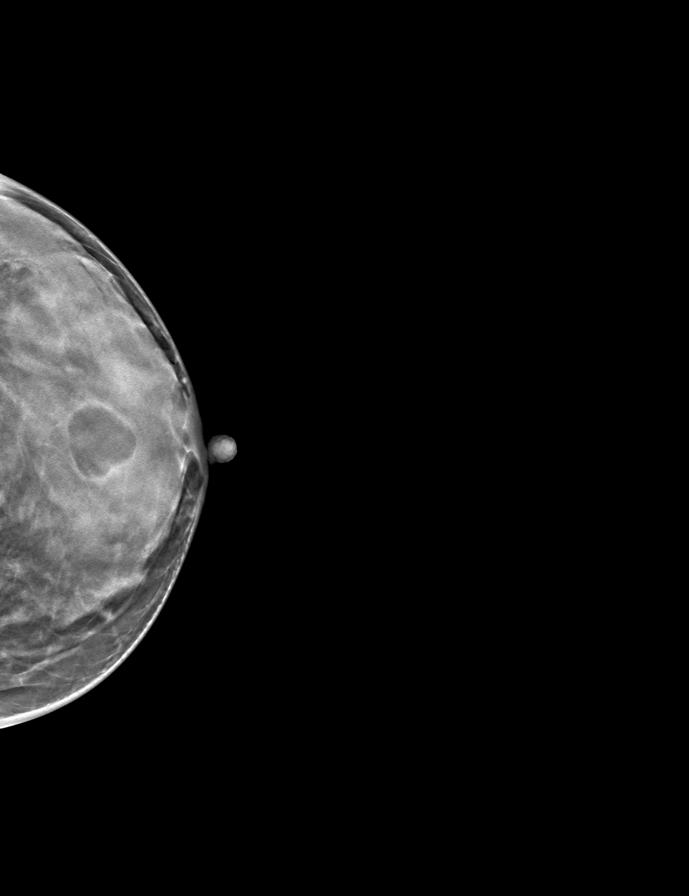
[frame 21/42]
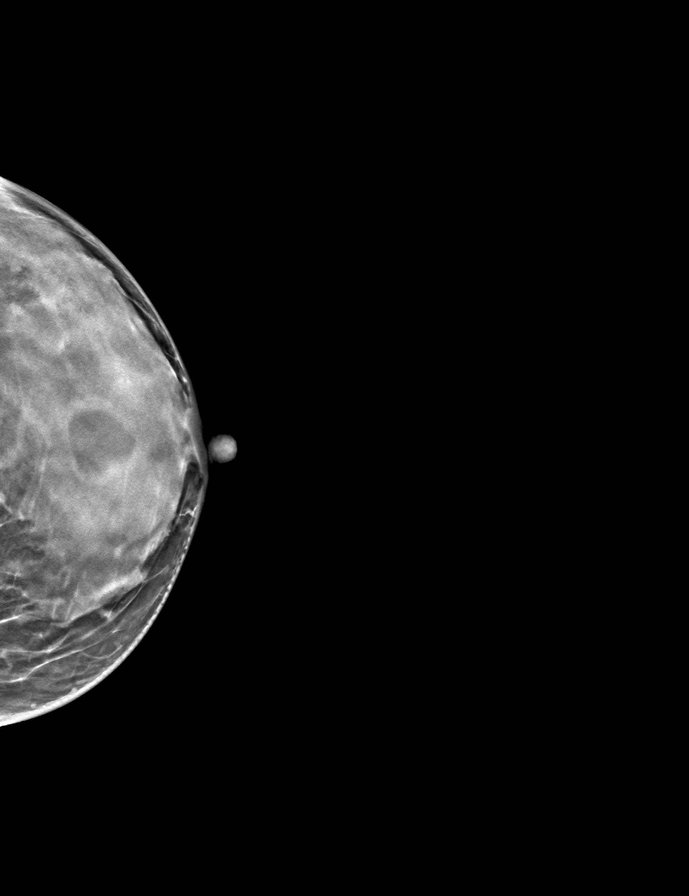

[R CC tomo · tomo slice 21/41.0]
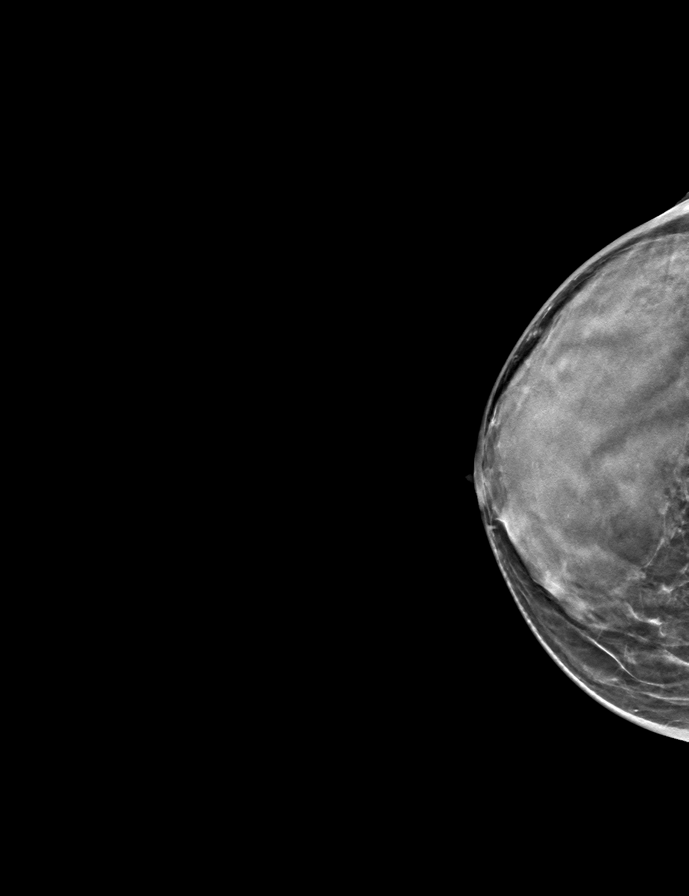

[R MLO tomo · tomo slice 23/45.0]
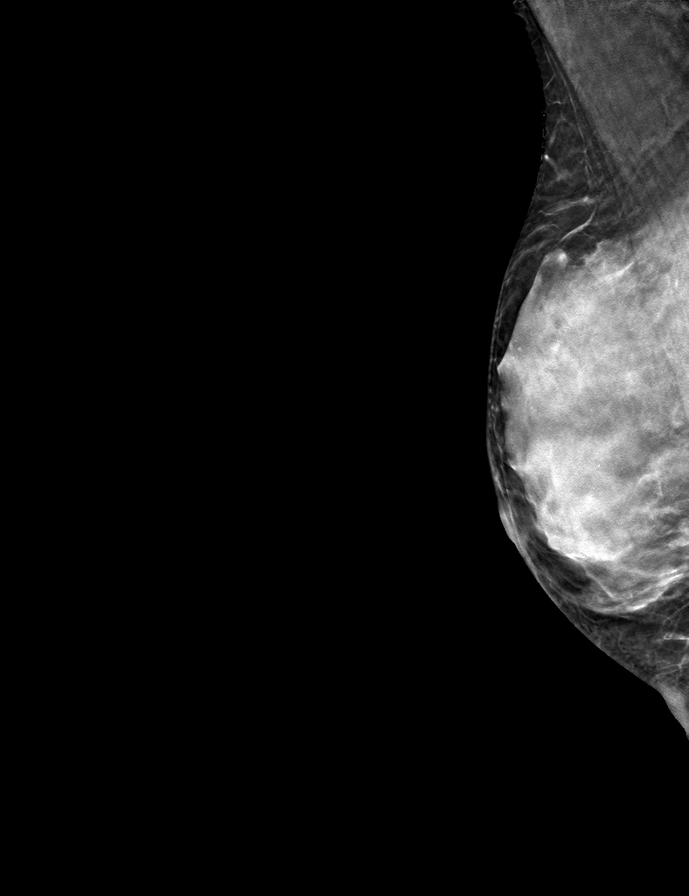

[L MLO tomo · tomo slice 21/42.0]
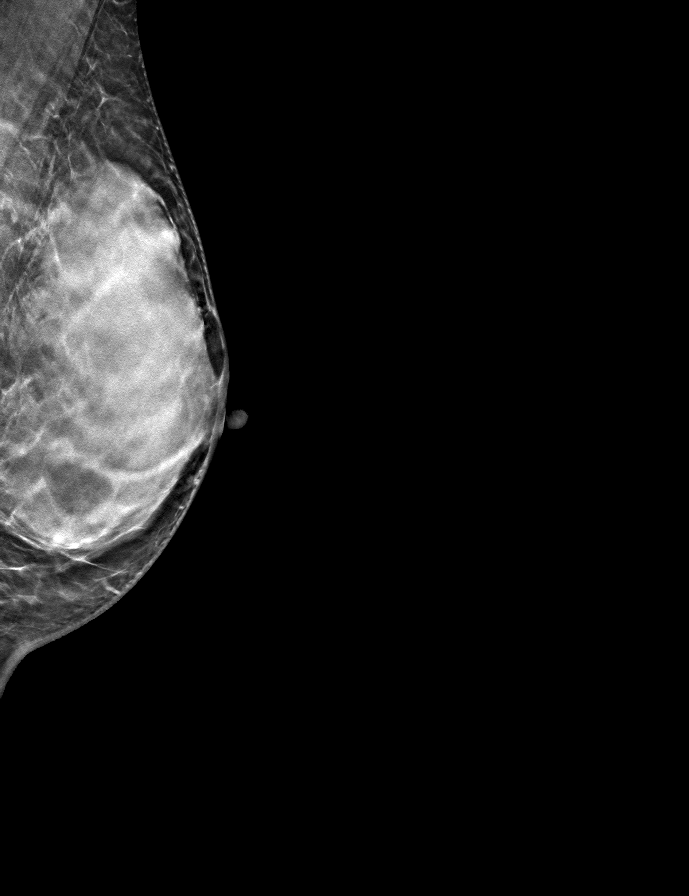

[9 of 24 positions shown; findings below may reference images not displayed]

ACR Breast Density Category d: The breast tissue is extremely dense,
which lowers the sensitivity of mammography
FINDINGS: There are no findings suspicious for malignancy.
IMPRESSION: No mammographic evidence of malignancy. A result letter of this
screening mammogram will be mailed directly to the patient.

RECOMMENDATION:
Screening mammogram in one year. (Code:[id number])

BI-RADS CATEGORY  1: Negative.

## 2023-09-03 ENCOUNTER — Encounter: Payer: Self-pay | Admitting: Emergency Medicine

## 2023-09-03 ENCOUNTER — Ambulatory Visit (INDEPENDENT_AMBULATORY_CARE_PROVIDER_SITE_OTHER): Payer: BC Managed Care – PPO | Admitting: Emergency Medicine

## 2023-09-03 VITALS — BP 118/80 | HR 70 | Temp 98.2°F | Ht 63.5 in | Wt 110.1 lb

## 2023-09-03 DIAGNOSIS — Z13220 Encounter for screening for lipoid disorders: Secondary | ICD-10-CM | POA: Diagnosis not present

## 2023-09-03 DIAGNOSIS — Z0000 Encounter for general adult medical examination without abnormal findings: Secondary | ICD-10-CM

## 2023-09-03 DIAGNOSIS — Z1329 Encounter for screening for other suspected endocrine disorder: Secondary | ICD-10-CM | POA: Diagnosis not present

## 2023-09-03 DIAGNOSIS — Z1211 Encounter for screening for malignant neoplasm of colon: Secondary | ICD-10-CM

## 2023-09-03 DIAGNOSIS — Z130 Encounter for screening for diseases of the blood and blood-forming organs and certain disorders involving the immune mechanism: Secondary | ICD-10-CM

## 2023-09-03 DIAGNOSIS — Z13228 Encounter for screening for other metabolic disorders: Secondary | ICD-10-CM

## 2023-09-03 LAB — CBC WITH DIFFERENTIAL/PLATELET
Basophils Absolute: 0 10*3/uL (ref 0.0–0.1)
Basophils Relative: 0.6 % (ref 0.0–3.0)
Eosinophils Absolute: 0 10*3/uL (ref 0.0–0.7)
Eosinophils Relative: 0.1 % (ref 0.0–5.0)
HCT: 37.2 % (ref 36.0–46.0)
Hemoglobin: 12.7 g/dL (ref 12.0–15.0)
Lymphocytes Relative: 23.1 % (ref 12.0–46.0)
Lymphs Abs: 0.7 10*3/uL (ref 0.7–4.0)
MCHC: 34 g/dL (ref 30.0–36.0)
MCV: 90.1 fL (ref 78.0–100.0)
Monocytes Absolute: 0.2 10*3/uL (ref 0.1–1.0)
Monocytes Relative: 7.5 % (ref 3.0–12.0)
Neutro Abs: 2.2 10*3/uL (ref 1.4–7.7)
Neutrophils Relative %: 68.7 % (ref 43.0–77.0)
Platelets: 194 10*3/uL (ref 150.0–400.0)
RBC: 4.13 Mil/uL (ref 3.87–5.11)
RDW: 14.1 % (ref 11.5–15.5)
WBC: 3.2 10*3/uL — ABNORMAL LOW (ref 4.0–10.5)

## 2023-09-03 LAB — HEMOGLOBIN A1C: Hgb A1c MFr Bld: 5.7 % (ref 4.6–6.5)

## 2023-09-03 LAB — LIPID PANEL
Cholesterol: 174 mg/dL (ref 0–200)
HDL: 86.3 mg/dL (ref >39.00)
LDL Cholesterol: 77 mg/dL (ref 0–99)
NonHDL: 88.06
Total CHOL/HDL Ratio: 2
Triglycerides: 55 mg/dL (ref 0.0–149.0)
VLDL: 11 mg/dL (ref 0.0–40.0)

## 2023-09-03 LAB — COMPREHENSIVE METABOLIC PANEL
ALT: 24 U/L (ref 0–35)
AST: 32 U/L (ref 0–37)
Albumin: 3.8 g/dL (ref 3.5–5.2)
Alkaline Phosphatase: 98 U/L (ref 39–117)
BUN: 13 mg/dL (ref 6–23)
CO2: 29 meq/L (ref 19–32)
Calcium: 8.8 mg/dL (ref 8.4–10.5)
Chloride: 104 meq/L (ref 96–112)
Creatinine, Ser: 0.67 mg/dL (ref 0.40–1.20)
GFR: 97.79 mL/min (ref >60.00)
Glucose, Bld: 99 mg/dL (ref 70–99)
Potassium: 3.9 meq/L (ref 3.5–5.1)
Sodium: 141 meq/L (ref 135–145)
Total Bilirubin: 1.7 mg/dL — ABNORMAL HIGH (ref 0.2–1.2)
Total Protein: 6.5 g/dL (ref 6.0–8.3)

## 2023-09-03 LAB — TSH: TSH: 1.16 u[IU]/mL (ref 0.35–5.50)

## 2023-09-03 NOTE — Patient Instructions (Signed)

## 2023-09-03 NOTE — Progress Notes (Signed)
Carolyn Woodward 57 y.o.   Chief Complaint  Patient presents with   Annual Exam    HISTORY OF PRESENT ILLNESS: This is a 57 y.o. female here for annual exam Healthy female with a healthy lifestyle Has no complaints or medical concerns today.  HPI   Prior to Admission medications   Medication Sig Start Date End Date Taking? Authorizing Provider  Cholecalciferol (D3 2000 PO) Take by mouth.   Yes [provider]  Multiple Vitamin (MULTIVITAMIN) capsule Take 1 capsule by mouth daily.   Yes [provider]  Probiotic Product (PROBIOTIC PO) Take by mouth.   Yes [provider]    No Known Allergies  There are no active problems to display for this patient.   No past medical history on file.  No past surgical history on file.  Social History   Socioeconomic History   Marital status: Married    Spouse name: Not on file   Number of children: Not on file   Years of education: Not on file   Highest education level: Not on file  Occupational History   Occupation: Location manager  Tobacco Use   Smoking status: Never   Smokeless tobacco: Never  Vaping Use   Vaping status: Never Used  Substance and Sexual Activity   Alcohol use: Not Currently    Comment: maybe 1 a month   Drug use: No   Sexual activity: Yes    Birth control/protection: Post-menopausal    Comment: 1st intercourse 57 yo-Fewer than 5 partners  Other Topics Concern   Not on file  Social History Narrative   Not on file   Social Drivers of Health   Financial Resource Strain: Not on file  Food Insecurity: Not on file  Transportation Needs: Not on file  Physical Activity: Not on file  Stress: Not on file  Social Connections: Not on file  Intimate Partner Violence: Not on file    Family History  Problem Relation Age of Onset   Heart attack Maternal Grandfather    Breast cancer Neg Hx      Review of Systems  Constitutional: Negative.  Negative for chills and fever.   HENT: Negative.  Negative for congestion and sore throat.   Respiratory: Negative.  Negative for cough and shortness of breath.   Cardiovascular: Negative.  Negative for chest pain and palpitations.  Gastrointestinal:  Negative for abdominal pain, diarrhea, nausea and vomiting.  Genitourinary: Negative.  Negative for dysuria and hematuria.  Skin: Negative.  Negative for rash.  Neurological: Negative.  Negative for dizziness and headaches.  All other systems reviewed and are negative.   Vitals:   09/03/23 0846  BP: 118/80  Pulse: 70  Temp: 98.2 F (36.8 C)  SpO2: 97%    Physical Exam Vitals reviewed.  Constitutional:      Appearance: Normal appearance.  HENT:     Head: Normocephalic.     Right Ear: Tympanic membrane, ear canal and external ear normal.     Left Ear: Tympanic membrane, ear canal and external ear normal.     Mouth/Throat:     Mouth: Mucous membranes are moist.     Pharynx: Oropharynx is clear.  Eyes:     Extraocular Movements: Extraocular movements intact.     Conjunctiva/sclera: Conjunctivae normal.     Pupils: Pupils are equal, round, and reactive to light.  Cardiovascular:     Rate and Rhythm: Normal rate and regular rhythm.     Pulses: Normal pulses.  Heart sounds: Normal heart sounds.  Pulmonary:     Effort: Pulmonary effort is normal.     Breath sounds: Normal breath sounds.  Musculoskeletal:     Cervical back: No tenderness.  Lymphadenopathy:     Cervical: No cervical adenopathy.  Skin:    General: Skin is warm and dry.     Capillary Refill: Capillary refill takes less than 2 seconds.  Neurological:     General: No focal deficit present.     Mental Status: She is alert and oriented to person, place, and time.  Psychiatric:        Mood and Affect: Mood normal.        Behavior: Behavior normal.      ASSESSMENT & PLAN: Problem List Items Addressed This Visit   None Visit Diagnoses       Routine general medical examination at a  health care facility    -  Primary   Relevant Orders   CBC with Differential/Platelet   Comprehensive metabolic panel   Lipid panel   TSH   HgB A1c     Screening for colon cancer       Relevant Orders   Ambulatory referral to Gastroenterology     Screening for deficiency anemia       Relevant Orders   CBC with Differential/Platelet     Screening for lipoid disorders       Relevant Orders   Lipid panel     Screening for endocrine, metabolic and immunity disorder       Relevant Orders   Comprehensive metabolic panel   TSH   HgB A1c      Modifiable risk factors discussed with patient. Anticipatory guidance according to age provided. The following topics were also discussed: Social Determinants of Health Smoking.  Non-smoker Diet and nutrition.  Good eating habits Benefits of exercise.  Exercises regularly.  Long-distance runner. Cancer screening and need for colon cancer screening with colonoscopy Vaccinations review and recommendations Cardiovascular risk assessment and need for blood work The 10-year ASCVD risk score (Arnett DK, et al., 2019) is: 1.1%   Values used to calculate the score:     Age: 63 years     Sex: Female     Is Non-Hispanic African American: No     Diabetic: No     Tobacco smoker: No     Systolic Blood Pressure: 118 mmHg     Is BP treated: No     HDL Cholesterol: 92.1 mg/dL     Total Cholesterol: 182 mg/dL  Mental health including depression and anxiety Fall and accident prevention  Patient Instructions  Health Maintenance, Female Adopting a healthy lifestyle and getting preventive care are important in promoting health and wellness. Ask your health care provider about: The right schedule for you to have regular tests and exams. Things you can do on your own to prevent diseases and keep yourself healthy. What should I know about diet, weight, and exercise? Eat a healthy diet  Eat a diet that includes plenty of vegetables, fruits, low-fat dairy  products, and lean protein. Do not eat a lot of foods that are high in solid fats, added sugars, or sodium. Maintain a healthy weight Body mass index (BMI) is used to identify weight problems. It estimates body fat based on height and weight. Your health care provider can help determine your BMI and help you achieve or maintain a healthy weight. Get regular exercise Get regular exercise. This is one of the most  important things you can do for your health. Most adults should: Exercise for at least 150 minutes each week. The exercise should increase your heart rate and make you sweat (moderate-intensity exercise). Do strengthening exercises at least twice a week. This is in addition to the moderate-intensity exercise. Spend less time sitting. Even light physical activity can be beneficial. Watch cholesterol and blood lipids Have your blood tested for lipids and cholesterol at 56 years of age, then have this test every 5 years. Have your cholesterol levels checked more often if: Your lipid or cholesterol levels are high. You are older than 56 years of age. You are at high risk for heart disease. What should I know about cancer screening? Depending on your health history and family history, you may need to have cancer screening at various ages. This may include screening for: Breast cancer. Cervical cancer. Colorectal cancer. Skin cancer. Lung cancer. What should I know about heart disease, diabetes, and high blood pressure? Blood pressure and heart disease High blood pressure causes heart disease and increases the risk of stroke. This is more likely to develop in people who have high blood pressure readings or are overweight. Have your blood pressure checked: Every 3-5 years if you are 32-56 years of age. Every year if you are 71 years old or older. Diabetes Have regular diabetes screenings. This checks your fasting blood sugar level. Have the screening done: Once every three years after  age 62 if you are at a normal weight and have a low risk for diabetes. More often and at a younger age if you are overweight or have a high risk for diabetes. What should I know about preventing infection? Hepatitis B If you have a higher risk for hepatitis B, you should be screened for this virus. Talk with your health care provider to find out if you are at risk for hepatitis B infection. Hepatitis C Testing is recommended for: Everyone born from [birth year] through 1965. Anyone with known risk factors for hepatitis C. Sexually transmitted infections (STIs) Get screened for STIs, including gonorrhea and chlamydia, if: You are sexually active and are younger than 56 years of age. You are older than 56 years of age and your health care provider tells you that you are at risk for this type of infection. Your sexual activity has changed since you were last screened, and you are at increased risk for chlamydia or gonorrhea. Ask your health care provider if you are at risk. Ask your health care provider about whether you are at high risk for HIV. Your health care provider may recommend a prescription medicine to help prevent HIV infection. If you choose to take medicine to prevent HIV, you should first get tested for HIV. You should then be tested every 3 months for as long as you are taking the medicine. Pregnancy If you are about to stop having your period (premenopausal) and you may become pregnant, seek counseling before you get pregnant. Take 400 to 800 micrograms (mcg) of folic acid every day if you become pregnant. Ask for birth control (contraception) if you want to prevent pregnancy. Osteoporosis and menopause Osteoporosis is a disease in which the bones lose minerals and strength with aging. This can result in bone fractures. If you are 53 years old or older, or if you are at risk for osteoporosis and fractures, ask your health care provider if you should: Be screened for bone loss. Take a  calcium or vitamin D supplement to lower your risk  of fractures. Be given hormone replacement therapy (HRT) to treat symptoms of menopause. Follow these instructions at home: Alcohol use Do not drink alcohol if: Your health care provider tells you not to drink. You are pregnant, may be pregnant, or are planning to become pregnant. If you drink alcohol: Limit how much you have to: 0-1 drink a day. Know how much alcohol is in your drink. In the [place] one drink equals one 12 oz bottle of beer (355 mL), one 5 oz glass of wine (148 mL), or one 1 oz glass of hard liquor (44 mL). Lifestyle Do not use any products that contain nicotine or tobacco. These products include cigarettes, chewing tobacco, and vaping devices, such as e-cigarettes. If you need help quitting, ask your health care provider. Do not use street drugs. Do not share needles. Ask your health care provider for help if you need support or information about quitting drugs. General instructions Schedule regular health, dental, and eye exams. Stay current with your vaccines. Tell your health care provider if: You often feel depressed. You have ever been abused or do not feel safe at home. Summary Adopting a healthy lifestyle and getting preventive care are important in promoting health and wellness. Follow your health care provider's instructions about healthy diet, exercising, and getting tested or screened for diseases. Follow your health care provider's instructions on monitoring your cholesterol and blood pressure. This information is not intended to replace advice given to you by your health care provider. Make sure you discuss any questions you have with your health care provider. Document Revised: 01/24/2021 Document Reviewed: 01/24/2021 Elsevier Patient Education  2024 Elsevier Inc.      Edwina Barth, MD [facility]

## 2023-11-15 ENCOUNTER — Other Ambulatory Visit: Payer: Self-pay | Admitting: Emergency Medicine

## 2023-11-15 DIAGNOSIS — Z1231 Encounter for screening mammogram for malignant neoplasm of breast: Secondary | ICD-10-CM

## 2023-12-05 ENCOUNTER — Ambulatory Visit
Admission: RE | Admit: 2023-12-05 | Discharge: 2023-12-05 | Disposition: A | Discharge status: Home / Self Care | Payer: BC Managed Care – PPO | Source: Ambulatory Visit | Attending: Emergency Medicine

## 2023-12-05 DIAGNOSIS — Z1231 Encounter for screening mammogram for malignant neoplasm of breast: Secondary | ICD-10-CM

## 2023-12-08 ENCOUNTER — Encounter: Payer: Self-pay | Admitting: Emergency Medicine

## 2024-03-12 ENCOUNTER — Telehealth: Payer: Self-pay | Admitting: Emergency Medicine

## 2024-03-12 ENCOUNTER — Other Ambulatory Visit: Payer: Self-pay | Admitting: Emergency Medicine

## 2024-03-12 MED ORDER — SCOPOLAMINE 1 MG/3DAYS TD PT72: 1.0000 | MEDICATED_PATCH | TRANSDERMAL | 1 refills | Status: AC

## 2024-03-12 NOTE — Telephone Encounter (Signed)
 Prescription for Transderm Scop  sent to pharmacy of record today.

## 2024-03-12 NOTE — Telephone Encounter (Signed)
 Copied from CRM [id number]. Topic: Clinical - Medication Question >> Mar 12, 2024  1:21 PM Carolyn Woodward wrote: Reason for CRM: Patient would like to know if a motion sickness medication can be prescribed for her as she is planning to go on a cruise for 2 week before April 11, 2024. Please contact patient to advise CB#[phone] (M)

## 2024-03-14 NOTE — Telephone Encounter (Signed)
 LVM informing patient requested motion sickness patches has been sent in to her pharmacy if any further questions she was to give our office a call back

## 2024-09-03 ENCOUNTER — Ambulatory Visit: Admitting: Emergency Medicine

## 2024-09-03 ENCOUNTER — Encounter: Payer: Self-pay | Admitting: Emergency Medicine

## 2024-09-03 ENCOUNTER — Ambulatory Visit: Payer: Self-pay | Admitting: Emergency Medicine

## 2024-09-03 VITALS — BP 114/64 | HR 73 | Temp 97.8°F | Ht 63.5 in | Wt 115.0 lb

## 2024-09-03 DIAGNOSIS — Z13220 Encounter for screening for lipoid disorders: Secondary | ICD-10-CM | POA: Diagnosis not present

## 2024-09-03 DIAGNOSIS — Z130 Encounter for screening for diseases of the blood and blood-forming organs and certain disorders involving the immune mechanism: Secondary | ICD-10-CM

## 2024-09-03 DIAGNOSIS — Z0000 Encounter for general adult medical examination without abnormal findings: Secondary | ICD-10-CM | POA: Diagnosis not present

## 2024-09-03 DIAGNOSIS — Z1329 Encounter for screening for other suspected endocrine disorder: Secondary | ICD-10-CM

## 2024-09-03 DIAGNOSIS — Z1211 Encounter for screening for malignant neoplasm of colon: Secondary | ICD-10-CM

## 2024-09-03 DIAGNOSIS — Z13228 Encounter for screening for other metabolic disorders: Secondary | ICD-10-CM | POA: Diagnosis not present

## 2024-09-03 LAB — CBC WITH DIFFERENTIAL/PLATELET
Basophils Absolute: 0 K/uL (ref 0.0–0.1)
Basophils Relative: 0.7 % (ref 0.0–3.0)
Eosinophils Absolute: 0 K/uL (ref 0.0–0.7)
Eosinophils Relative: 0.8 % (ref 0.0–5.0)
HCT: 37.5 % (ref 36.0–46.0)
Hemoglobin: 12.9 g/dL (ref 12.0–15.0)
Lymphocytes Relative: 24.7 % (ref 12.0–46.0)
Lymphs Abs: 0.9 K/uL (ref 0.7–4.0)
MCHC: 34.4 g/dL (ref 30.0–36.0)
MCV: 89 fl (ref 78.0–100.0)
Monocytes Absolute: 0.3 K/uL (ref 0.1–1.0)
Monocytes Relative: 6.7 % (ref 3.0–12.0)
Neutro Abs: 2.6 K/uL (ref 1.4–7.7)
Neutrophils Relative %: 67.1 % (ref 43.0–77.0)
Platelets: 166 K/uL (ref 150.0–400.0)
RBC: 4.22 Mil/uL (ref 3.87–5.11)
RDW: 13.4 % (ref 11.5–15.5)
WBC: 3.8 K/uL — ABNORMAL LOW (ref 4.0–10.5)

## 2024-09-03 LAB — LIPID PANEL
Cholesterol: 185 mg/dL (ref 28–200)
HDL: 90.3 mg/dL (ref >39.00)
LDL Cholesterol: 85 mg/dL (ref 10–99)
NonHDL: 94.61
Total CHOL/HDL Ratio: 2
Triglycerides: 47 mg/dL (ref 10.0–149.0)
VLDL: 9.4 mg/dL (ref 0.0–40.0)

## 2024-09-03 LAB — COMPREHENSIVE METABOLIC PANEL WITH GFR
ALT: 22 U/L (ref 3–35)
AST: 28 U/L (ref 5–37)
Albumin: 4.1 g/dL (ref 3.5–5.2)
Alkaline Phosphatase: 92 U/L (ref 39–117)
BUN: 18 mg/dL (ref 6–23)
CO2: 30 meq/L (ref 19–32)
Calcium: 8.8 mg/dL (ref 8.4–10.5)
Chloride: 103 meq/L (ref 96–112)
Creatinine, Ser: 0.66 mg/dL (ref 0.40–1.20)
GFR: 97.45 mL/min (ref >60.00)
Glucose, Bld: 91 mg/dL (ref 70–99)
Potassium: 3.6 meq/L (ref 3.5–5.1)
Sodium: 139 meq/L (ref 135–145)
Total Bilirubin: 1.4 mg/dL — ABNORMAL HIGH (ref 0.2–1.2)
Total Protein: 6.5 g/dL (ref 6.0–8.3)

## 2024-09-03 LAB — HEMOGLOBIN A1C: Hgb A1c MFr Bld: 5.4 % (ref 4.6–6.5)

## 2024-09-03 NOTE — Progress Notes (Signed)
 Carolyn Woodward 58 y.o.   Chief Complaint  Patient presents with   Annual Exam    HISTORY OF PRESENT ILLNESS: This is a 58 y.o. female here for annual exam. Healthy female with a healthy lifestyle No chronic medical conditions Needs to schedule colonoscopy No complaints or medical concerns today.  HPI   Prior to Admission medications  Medication Sig Start Date End Date Taking? Authorizing Provider  Cholecalciferol (D3 2000 PO) Take by mouth.   Yes [provider]  Multiple Vitamin (MULTIVITAMIN) capsule Take 1 capsule by mouth daily.   Yes [provider]  Probiotic Product (PROBIOTIC PO) Take by mouth.   Yes [provider]  scopolamine  (TRANSDERM SCOP , 1.5 MG,) 1 MG/3DAYS Place 1 patch (1.5 mg total) onto the skin every 3 (three) days. 03/12/24  Yes Purcell Emil Schanz, MD    Allergies[1]  There are no active problems to display for this patient.   No past medical history on file.  No past surgical history on file.  Social History   Socioeconomic History   Marital status: Married    Spouse name: Not on file   Number of children: Not on file   Years of education: Not on file   Highest education level: Not on file  Occupational History   Occupation: location manager  Tobacco Use   Smoking status: Never   Smokeless tobacco: Never  Vaping Use   Vaping status: Never Used  Substance and Sexual Activity   Alcohol use: Not Currently    Comment: maybe 1 a month   Drug use: No   Sexual activity: Yes    Birth control/protection: Post-menopausal    Comment: 1st intercourse 58 yo-Fewer than 5 partners  Other Topics Concern   Not on file  Social History Narrative   Not on file   Social Drivers of Health   Tobacco Use: Low Risk (12/05/2023)   Patient History    Smoking Tobacco Use: Never    Smokeless Tobacco Use: Never    Passive Exposure: Not on file  Financial Resource Strain: Not on file  Food Insecurity: Not on file   Transportation Needs: Not on file  Physical Activity: Not on file  Stress: Not on file  Social Connections: Not on file  Intimate Partner Violence: Not on file  Depression (PHQ2-9): Low Risk (09/03/2023)   Depression (PHQ2-9)    PHQ-2 Score: 0  Alcohol Screen: Not on file  Housing: Not on file  Utilities: Not on file  Health Literacy: Not on file    Family History  Problem Relation Age of Onset   Heart attack Maternal Grandfather    Breast cancer Neg Hx      Review of Systems  Constitutional: Negative.  Negative for chills and fever.  HENT: Negative.  Negative for congestion and sore throat.   Respiratory: Negative.  Negative for cough and shortness of breath.   Cardiovascular: Negative.  Negative for chest pain and palpitations.  Gastrointestinal:  Negative for abdominal pain, diarrhea, nausea and vomiting.  Genitourinary: Negative.  Negative for dysuria and hematuria.  Skin: Negative.  Negative for rash.  Neurological: Negative.  Negative for dizziness and headaches.  All other systems reviewed and are negative.   Today's Vitals   09/03/24 0835  BP: 114/64  Pulse: 73  Temp: 97.8 F (36.6 C)  TempSrc: Oral  SpO2: 98%  Weight: 115 lb (52.2 kg)  Height: 5' 3.5 (1.613 m)   Body mass index is 20.05 kg/m.  Physical Exam Vitals reviewed.  Constitutional:      Appearance: Normal appearance.  HENT:     Head: Normocephalic.     Right Ear: Tympanic membrane, ear canal and external ear normal.     Left Ear: Tympanic membrane, ear canal and external ear normal.     Mouth/Throat:     Mouth: Mucous membranes are moist.     Pharynx: Oropharynx is clear.  Eyes:     Extraocular Movements: Extraocular movements intact.     Conjunctiva/sclera: Conjunctivae normal.     Pupils: Pupils are equal, round, and reactive to light.  Cardiovascular:     Rate and Rhythm: Normal rate and regular rhythm.     Pulses: Normal pulses.     Heart sounds: Normal heart sounds.   Pulmonary:     Effort: Pulmonary effort is normal.     Breath sounds: Normal breath sounds.  Abdominal:     Palpations: Abdomen is soft.     Tenderness: There is no abdominal tenderness.  Musculoskeletal:     Cervical back: No tenderness.  Lymphadenopathy:     Cervical: No cervical adenopathy.  Skin:    General: Skin is warm and dry.     Capillary Refill: Capillary refill takes less than 2 seconds.  Neurological:     General: No focal deficit present.     Mental Status: She is alert and oriented to person, place, and time.  Psychiatric:        Mood and Affect: Mood normal.        Behavior: Behavior normal.      ASSESSMENT & PLAN: Problem List Items Addressed This Visit   None Visit Diagnoses       Routine general medical examination at a health care facility    -  Primary   Relevant Orders   Comprehensive metabolic panel with GFR   CBC with Differential/Platelet   Hemoglobin A1c   Lipid panel     Screening for deficiency anemia       Relevant Orders   CBC with Differential/Platelet     Screening for lipoid disorders       Relevant Orders   Lipid panel     Screening for endocrine, metabolic and immunity disorder       Relevant Orders   Comprehensive metabolic panel with GFR   Hemoglobin A1c     Screening for colon cancer       Relevant Orders   Ambulatory referral to Gastroenterology      Modifiable risk factors discussed with patient. Anticipatory guidance according to age provided. The following topics were also discussed: Social Determinants of Health Smoking.  Non-smoker Diet and nutrition Benefits of exercise Cancer screening and need for colon cancer screening with colonoscopy Vaccinations review and recommendations Cardiovascular risk assessment and need for blood work Mental health including depression and anxiety Fall and accident prevention  Patient Instructions  Health Maintenance, Female Adopting a healthy lifestyle and getting preventive  care are important in promoting health and wellness. Ask your health care provider about: The right schedule for you to have regular tests and exams. Things you can do on your own to prevent diseases and keep yourself healthy. What should I know about diet, weight, and exercise? Eat a healthy diet  Eat a diet that includes plenty of vegetables, fruits, low-fat dairy products, and lean protein. Do not eat a lot of foods that are high in solid fats, added sugars, or sodium. Maintain a healthy weight Body mass  index (BMI) is used to identify weight problems. It estimates body fat based on height and weight. Your health care provider can help determine your BMI and help you achieve or maintain a healthy weight. Get regular exercise Get regular exercise. This is one of the most important things you can do for your health. Most adults should: Exercise for at least 150 minutes each week. The exercise should increase your heart rate and make you sweat (moderate-intensity exercise). Do strengthening exercises at least twice a week. This is in addition to the moderate-intensity exercise. Spend less time sitting. Even light physical activity can be beneficial. Watch cholesterol and blood lipids Have your blood tested for lipids and cholesterol at 57 years of age, then have this test every 5 years. Have your cholesterol levels checked more often if: Your lipid or cholesterol levels are high. You are older than 57 years of age. You are at high risk for heart disease. What should I know about cancer screening? Depending on your health history and family history, you may need to have cancer screening at various ages. This may include screening for: Breast cancer. Cervical cancer. Colorectal cancer. Skin cancer. Lung cancer. What should I know about heart disease, diabetes, and high blood pressure? Blood pressure and heart disease High blood pressure causes heart disease and increases the risk of  stroke. This is more likely to develop in people who have high blood pressure readings or are overweight. Have your blood pressure checked: Every 3-5 years if you are 34-77 years of age. Every year if you are 54 years old or older. Diabetes Have regular diabetes screenings. This checks your fasting blood sugar level. Have the screening done: Once every three years after age 51 if you are at a normal weight and have a low risk for diabetes. More often and at a younger age if you are overweight or have a high risk for diabetes. What should I know about preventing infection? Hepatitis B If you have a higher risk for hepatitis B, you should be screened for this virus. Talk with your health care provider to find out if you are at risk for hepatitis B infection. Hepatitis C Testing is recommended for: Everyone born from [birth year] through 1965. Anyone with known risk factors for hepatitis C. Sexually transmitted infections (STIs) Get screened for STIs, including gonorrhea and chlamydia, if: You are sexually active and are younger than 57 years of age. You are older than 57 years of age and your health care provider tells you that you are at risk for this type of infection. Your sexual activity has changed since you were last screened, and you are at increased risk for chlamydia or gonorrhea. Ask your health care provider if you are at risk. Ask your health care provider about whether you are at high risk for HIV. Your health care provider may recommend a prescription medicine to help prevent HIV infection. If you choose to take medicine to prevent HIV, you should first get tested for HIV. You should then be tested every 3 months for as long as you are taking the medicine. Pregnancy If you are about to stop having your period (premenopausal) and you may become pregnant, seek counseling before you get pregnant. Take 400 to 800 micrograms (mcg) of folic acid every day if you become pregnant. Ask for birth  control (contraception) if you want to prevent pregnancy. Osteoporosis and menopause Osteoporosis is a disease in which the bones lose minerals and strength with aging. This  can result in bone fractures. If you are 70 years old or older, or if you are at risk for osteoporosis and fractures, ask your health care provider if you should: Be screened for bone loss. Take a calcium or vitamin D  supplement to lower your risk of fractures. Be given hormone replacement therapy (HRT) to treat symptoms of menopause. Follow these instructions at home: Alcohol use Do not drink alcohol if: Your health care provider tells you not to drink. You are pregnant, may be pregnant, or are planning to become pregnant. If you drink alcohol: Limit how much you have to: 0-1 drink a day. Know how much alcohol is in your drink. In the [place] one drink equals one 12 oz bottle of beer (355 mL), one 5 oz glass of wine (148 mL), or one 1 oz glass of hard liquor (44 mL). Lifestyle Do not use any products that contain nicotine or tobacco. These products include cigarettes, chewing tobacco, and vaping devices, such as e-cigarettes. If you need help quitting, ask your health care provider. Do not use street drugs. Do not share needles. Ask your health care provider for help if you need support or information about quitting drugs. General instructions Schedule regular health, dental, and eye exams. Stay current with your vaccines. Tell your health care provider if: You often feel depressed. You have ever been abused or do not feel safe at home. Summary Adopting a healthy lifestyle and getting preventive care are important in promoting health and wellness. Follow your health care provider's instructions about healthy diet, exercising, and getting tested or screened for diseases. Follow your health care provider's instructions on monitoring your cholesterol and blood pressure. This information is not intended to replace  advice given to you by your health care provider. Make sure you discuss any questions you have with your health care provider. Document Revised: 01/24/2021 Document Reviewed: 01/24/2021 Elsevier Patient Education  2024 Elsevier Inc.      Emil Schaumann, MD [facility]    [1] No Known Allergies

## 2024-09-03 NOTE — Patient Instructions (Signed)
